# Patient Record
Sex: Female | Born: 1975 | Race: Black or African American | Hispanic: No | Smoking: Current every day smoker
Health system: Southern US, Community
[De-identification: ages and names within clinical notes are randomized; demographics above are authoritative.]

## PROBLEM LIST (undated history)

## (undated) DIAGNOSIS — I10 Essential (primary) hypertension: Secondary | ICD-10-CM

## (undated) DIAGNOSIS — F419 Anxiety disorder, unspecified: Secondary | ICD-10-CM

## (undated) DIAGNOSIS — E669 Obesity, unspecified: Secondary | ICD-10-CM

## (undated) HISTORY — PX: TUBAL LIGATION: SHX77

## (undated) HISTORY — DX: Obesity, unspecified: E66.9

---

## 2007-01-09 ENCOUNTER — Ambulatory Visit: Payer: Self-pay | Admitting: Physician Assistant

## 2007-03-15 ENCOUNTER — Ambulatory Visit: Payer: Self-pay | Admitting: Obstetrics & Gynecology

## 2007-04-05 ENCOUNTER — Ambulatory Visit: Payer: Self-pay | Admitting: Family Medicine

## 2007-04-16 ENCOUNTER — Encounter: Payer: Self-pay | Admitting: Family Medicine

## 2007-04-19 ENCOUNTER — Ambulatory Visit: Payer: Self-pay | Admitting: Family Medicine

## 2007-04-19 DIAGNOSIS — M545 Low back pain, unspecified: Secondary | ICD-10-CM | POA: Insufficient documentation

## 2007-04-23 ENCOUNTER — Encounter: Admission: RE | Admit: 2007-04-23 | Discharge: 2007-04-23 | Payer: Self-pay | Admitting: Family Medicine

## 2007-04-26 ENCOUNTER — Encounter: Admission: RE | Admit: 2007-04-26 | Discharge: 2007-05-09 | Payer: Self-pay | Admitting: Family Medicine

## 2007-08-21 ENCOUNTER — Ambulatory Visit: Payer: Self-pay | Admitting: Obstetrics & Gynecology

## 2007-08-28 ENCOUNTER — Ambulatory Visit: Payer: Self-pay | Admitting: Obstetrics & Gynecology

## 2007-10-15 ENCOUNTER — Telehealth: Payer: Self-pay | Admitting: Family Medicine

## 2007-11-05 ENCOUNTER — Ambulatory Visit: Payer: Self-pay | Admitting: Obstetrics and Gynecology

## 2008-01-10 ENCOUNTER — Ambulatory Visit: Payer: Self-pay | Admitting: Physician Assistant

## 2008-01-10 LAB — CONVERTED CEMR LAB: Chlamydia, DNA Probe: NEGATIVE

## 2008-02-04 ENCOUNTER — Ambulatory Visit: Payer: Self-pay | Admitting: Family

## 2008-02-05 ENCOUNTER — Encounter: Payer: Self-pay | Admitting: Family

## 2008-02-05 LAB — CONVERTED CEMR LAB: Trich, Wet Prep: NONE SEEN

## 2008-02-17 ENCOUNTER — Ambulatory Visit: Payer: Self-pay | Admitting: Family Medicine

## 2008-03-09 ENCOUNTER — Telehealth: Payer: Self-pay | Admitting: Family Medicine

## 2008-03-12 ENCOUNTER — Telehealth: Payer: Self-pay | Admitting: Family Medicine

## 2008-03-16 ENCOUNTER — Encounter: Admission: RE | Admit: 2008-03-16 | Discharge: 2008-03-17 | Payer: Self-pay | Admitting: Family Medicine

## 2008-03-20 ENCOUNTER — Encounter: Payer: Self-pay | Admitting: Family

## 2008-03-20 ENCOUNTER — Ambulatory Visit: Payer: Self-pay | Admitting: Family

## 2008-03-20 LAB — CONVERTED CEMR LAB: Trich, Wet Prep: NONE SEEN

## 2008-04-10 ENCOUNTER — Ambulatory Visit: Payer: Self-pay | Admitting: Physician Assistant

## 2008-04-14 ENCOUNTER — Ambulatory Visit: Payer: Self-pay | Admitting: Family Medicine

## 2008-04-16 ENCOUNTER — Encounter: Payer: Self-pay | Admitting: Family Medicine

## 2008-04-20 ENCOUNTER — Encounter: Payer: Self-pay | Admitting: Family Medicine

## 2008-05-05 ENCOUNTER — Ambulatory Visit: Payer: Self-pay | Admitting: Family Medicine

## 2008-05-06 ENCOUNTER — Encounter: Payer: Self-pay | Admitting: Family Medicine

## 2008-05-06 ENCOUNTER — Encounter: Admission: RE | Admit: 2008-05-06 | Discharge: 2008-05-06 | Payer: Self-pay | Admitting: Family Medicine

## 2008-05-06 ENCOUNTER — Telehealth (INDEPENDENT_AMBULATORY_CARE_PROVIDER_SITE_OTHER): Payer: Self-pay

## 2008-05-06 ENCOUNTER — Ambulatory Visit: Payer: Self-pay | Admitting: Family Medicine

## 2008-05-06 DIAGNOSIS — R1031 Right lower quadrant pain: Secondary | ICD-10-CM | POA: Insufficient documentation

## 2008-05-06 LAB — CONVERTED CEMR LAB
Glucose, Urine, Semiquant: NEGATIVE
Specific Gravity, Urine: 1.015
pH: 7

## 2008-05-08 ENCOUNTER — Encounter: Payer: Self-pay | Admitting: Family Medicine

## 2008-05-08 ENCOUNTER — Encounter: Admission: RE | Admit: 2008-05-08 | Discharge: 2008-05-08 | Payer: Self-pay | Admitting: Obstetrics & Gynecology

## 2008-05-08 ENCOUNTER — Ambulatory Visit: Payer: Self-pay | Admitting: Obstetrics and Gynecology

## 2008-05-08 LAB — CONVERTED CEMR LAB
Basophils Absolute: 0 10*3/uL (ref 0.0–0.1)
Basophils Relative: 0 % (ref 0–1)
Candida species: NEGATIVE
Eosinophils Relative: 1 % (ref 0–5)
HCT: 37.6 % (ref 36.0–46.0)
Hemoglobin: 12.2 g/dL (ref 12.0–15.0)
Lymphocytes Relative: 12 % (ref 12–46)
MCHC: 32.4 g/dL (ref 30.0–36.0)
Monocytes Absolute: 0.9 10*3/uL (ref 0.1–1.0)
Monocytes Relative: 6 % (ref 3–12)
RDW: 12.9 % (ref 11.5–15.5)

## 2008-05-11 ENCOUNTER — Telehealth: Payer: Self-pay | Admitting: Family Medicine

## 2008-05-29 ENCOUNTER — Ambulatory Visit: Payer: Self-pay | Admitting: Family Medicine

## 2008-05-29 DIAGNOSIS — K5289 Other specified noninfective gastroenteritis and colitis: Secondary | ICD-10-CM | POA: Insufficient documentation

## 2008-06-17 ENCOUNTER — Encounter: Admission: RE | Admit: 2008-06-17 | Discharge: 2008-06-17 | Payer: Self-pay | Admitting: Obstetrics & Gynecology

## 2008-07-31 ENCOUNTER — Ambulatory Visit: Payer: Self-pay | Admitting: Family

## 2008-07-31 LAB — CONVERTED CEMR LAB: Trich, Wet Prep: NONE SEEN

## 2008-08-05 ENCOUNTER — Telehealth: Payer: Self-pay | Admitting: Family Medicine

## 2008-08-21 ENCOUNTER — Ambulatory Visit: Payer: Self-pay | Admitting: Physician Assistant

## 2008-08-22 ENCOUNTER — Encounter: Payer: Self-pay | Admitting: Physician Assistant

## 2008-08-22 LAB — CONVERTED CEMR LAB
Trich, Wet Prep: NONE SEEN
Yeast Wet Prep HPF POC: NONE SEEN

## 2008-09-25 ENCOUNTER — Ambulatory Visit: Payer: Self-pay | Admitting: Obstetrics and Gynecology

## 2009-01-11 ENCOUNTER — Ambulatory Visit: Payer: Self-pay | Admitting: Obstetrics and Gynecology

## 2009-01-12 ENCOUNTER — Encounter: Payer: Self-pay | Admitting: Obstetrics and Gynecology

## 2009-01-12 LAB — CONVERTED CEMR LAB

## 2009-01-20 ENCOUNTER — Ambulatory Visit: Payer: Self-pay | Admitting: Obstetrics & Gynecology

## 2009-01-20 LAB — CONVERTED CEMR LAB
Chlamydia, DNA Probe: NEGATIVE
Clue Cells Wet Prep HPF POC: NONE SEEN
Yeast Wet Prep HPF POC: NONE SEEN

## 2009-06-05 ENCOUNTER — Ambulatory Visit: Payer: Self-pay | Admitting: Family Medicine

## 2009-06-05 DIAGNOSIS — Z8742 Personal history of other diseases of the female genital tract: Secondary | ICD-10-CM | POA: Insufficient documentation

## 2009-06-05 LAB — CONVERTED CEMR LAB
Blood in Urine, dipstick: NEGATIVE
Specific Gravity, Urine: 1.02
Urobilinogen, UA: 4
pH: 7

## 2009-06-11 LAB — CONVERTED CEMR LAB
ALT: 11 units/L (ref 0–35)
AST: 14 units/L (ref 0–37)
BUN: 10 mg/dL (ref 6–23)
Basophils Absolute: 0 10*3/uL (ref 0.0–0.1)
Basophils Relative: 0 % (ref 0–1)
Calcium: 9.2 mg/dL (ref 8.4–10.5)
Chloride: 102 meq/L (ref 96–112)
Creatinine, Ser: 0.89 mg/dL (ref 0.40–1.20)
Eosinophils Absolute: 0.1 10*3/uL (ref 0.0–0.7)
Eosinophils Relative: 1 % (ref 0–5)
HCT: 42.3 % (ref 36.0–46.0)
Hgb A1c MFr Bld: 5.4 % (ref 4.6–6.1)
MCHC: 33.3 g/dL (ref 30.0–36.0)
MCV: 95.5 fL (ref 78.0–100.0)
Neutrophils Relative %: 66 % (ref 43–77)
Platelets: 196 10*3/uL (ref 150–400)
RDW: 12.9 % (ref 11.5–15.5)
Total Bilirubin: 0.5 mg/dL (ref 0.3–1.2)
WBC: 11.3 10*3/uL — ABNORMAL HIGH (ref 4.0–10.5)

## 2009-07-20 ENCOUNTER — Ambulatory Visit: Payer: Self-pay | Admitting: Obstetrics & Gynecology

## 2009-07-21 ENCOUNTER — Encounter: Payer: Self-pay | Admitting: Obstetrics & Gynecology

## 2009-07-21 LAB — CONVERTED CEMR LAB: GC Probe Amp, Genital: NEGATIVE

## 2009-07-30 ENCOUNTER — Ambulatory Visit: Payer: Self-pay | Admitting: Obstetrics & Gynecology

## 2009-07-30 LAB — CONVERTED CEMR LAB
Trich, Wet Prep: NONE SEEN
Yeast Wet Prep HPF POC: NONE SEEN

## 2010-01-26 ENCOUNTER — Emergency Department (HOSPITAL_BASED_OUTPATIENT_CLINIC_OR_DEPARTMENT_OTHER): Admission: EM | Admit: 2010-01-26 | Discharge: 2010-01-26 | Payer: Self-pay | Admitting: Emergency Medicine

## 2010-01-26 ENCOUNTER — Ambulatory Visit: Payer: Self-pay | Admitting: Radiology

## 2010-04-12 NOTE — Assessment & Plan Note (Signed)
Summary: LEFT KNEE SWELL PAIN/BACK STRAIN/VAG IRRITATION/TJ x 1wk rm 3   Vital Signs:  Patient Profile:   35 Years Old Female CC:      L Knee pain/LBP? Vaginal irritation  - x 1 wk Height:     62.5 inches Weight:      184 pounds O2 Sat:      100 % O2 treatment:    Room Air Temp:     97.2 degrees F oral Pulse rate:   93 / minute Pulse rhythm:   regular Resp:     16 per minute BP sitting:   134 / 93  (right arm) Cuff size:   regular  Vitals Entered By: Areta Haber, CMA                  Prior Medication List:  * TENS UNIT Condition: Recurrent chronic low back pain. Responded well to unit during her Physical therapy session.   Duration: Lifetime. PROMETHAZINE HCL 25 MG TABS (PROMETHAZINE HCL) one by mouth every 4-6 hours as needed nausea FLEXERIL 10 MG TABS (CYCLOBENZAPRINE HCL) Take 1 tablet by mouth once a day at bedtime   Current Allergies: No known allergies History of Present Illness Chief Complaint: L Knee pain/LBP? Vaginal irritation  - x 1 wk History of Present Illness: Patient has had L knee pain and it is swollen. She has beeen doing increase work w/ a client causing increase back pain and then noticied Tuesday L knee pain . Past HX of L knee pain w/ dislocation.  She nows has back and L knee pain.   She also has had recurrent yeast infection dicharge and was to be tested for diabetes.   Current Problems: DIABETES MELLITUS, GESTATIONAL, HX OF (ICD-V13.29) VAGINITIS (ICD-616.10) BACK PAIN (ICD-724.5) MUSCLE SPASM (ICD-728.85) GASTROENTERITIS (ICD-558.9) RLQ PAIN (ICD-789.03) LUMBAGO (ICD-724.2)   Current Meds * TENS UNIT Condition: Recurrent chronic low back pain. Responded well to unit during her Physical therapy session.   Duration: Lifetime. TERAZOL 3 80 MG SUPP (TERCONAZOLE) sig i per vagina at night x3 ORPHENADRINE CITRATE CR 100 MG XR12H-TAB (ORPHENADRINE CITRATE) sig 1 by mouth twice aday for back pain and muscle spasm MOBIC 7.5 MG TABS (MELOXICAM)  sig 1 by mouth q day HYDROCODONE-ACETAMINOPHEN 5-325 MG TABS (HYDROCODONE-ACETAMINOPHEN) sig 1 by mouth q 6-8hrs prn  REVIEW OF SYSTEMS Constitutional Symptoms      Denies fever, chills, night sweats, weight loss, weight gain, and fatigue.  Eyes       Denies change in vision, eye pain, eye discharge, glasses, contact lenses, and eye surgery. Ear/Nose/Throat/Mouth       Denies hearing loss/aids, change in hearing, ear pain, ear discharge, dizziness, frequent runny nose, frequent nose bleeds, sinus problems, sore throat, hoarseness, and tooth pain or bleeding.  Respiratory       Denies dry cough, productive cough, wheezing, shortness of breath, asthma, bronchitis, and emphysema/COPD.  Cardiovascular       Denies murmurs, chest pain, and tires easily with exhertion.    Gastrointestinal       Denies stomach pain, nausea/vomiting, diarrhea, constipation, blood in bowel movements, and indigestion. Genitourniary       Complains of blood or discharge from vagina.      Denies painful urination, kidney stones, and loss of urinary control.      Comments: Irritation- pool Neurological       Denies paralysis, seizures, and fainting/blackouts. Musculoskeletal       Complains of muscle pain, joint pain, decreased range of  motion, redness, and swelling.      Denies joint stiffness, muscle weakness, and gout.      Comments: L knee, LBP x 1 wk Skin       Denies bruising, unusual mles/lumps or sores, and hair/skin or nail changes.  Psych       Denies mood changes, temper/anger issues, anxiety/stress, speech problems, depression, and sleep problems. 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 Pt has not seen PCP for this.   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  Has 2 sons. Smokes 1/2 ppd x 13 yrs.-- quit 2009 No exercise. Fair diet.    Risk Factors: Caffeine Use: 2 (04/05/2007)  Risk Factors: Smoking Status: quit (04/14/2008) Packs/Day: 0.5 (04/05/2007)  Family History: Reviewed history from 04/05/2007 and no changes required. mother alive, breast CA at 55, DM, high chol, HTN father healthy no sibblings  Social History: Reviewed history from 04/14/2008 and no changes required. 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 Smokes 1/2 ppd x 13 yrs.-- quit 2009 No exercise. Fair diet.   Physical Exam General appearance: well developed, well nourished, no acute distress Head: normocephalic, atraumatic GU: external genitalia normal slight discharge present   bimanual and rectal exam negative Extremities: L knee some swelling and trnderness to palpation   Back: marked tenderness on both sides of back  Skin: no obvious rashes or lesions MSE: oriented to time, place, and person Assessment New Problems: DIABETES MELLITUS, GESTATIONAL, HX OF (ICD-V13.29) VAGINITIS (ICD-616.10) BACK PAIN (ICD-724.5) MUSCLE SPASM (ICD-728.85)  L knee pain  back pain HX of gestational diabetes  Patient Education: Patient and/or caregiver instructed in the following: rest fluids and Tylenol.  Plan New Medications/Changes: HYDROCODONE-ACETAMINOPHEN 5-325 MG TABS (HYDROCODONE-ACETAMINOPHEN) sig 1 by mouth q 6-8hrs prn  #20 x 0, 06/05/2009, Hassan Rowan MD MOBIC 7.5 MG TABS (MELOXICAM) sig 1 by mouth q day  #30 x 1, 06/05/2009, Hassan Rowan MD ORPHENADRINE CITRATE CR 100 MG XR12H-TAB (ORPHENADRINE CITRATE) sig 1 by mouth twice aday for back pain and muscle spasm  #30 x 1, 06/05/2009, Hassan Rowan MD TERAZOL 3 80 MG SUPP (TERCONAZOLE) sig i per vagina at night x3  #1 x  1, 06/05/2009, Hassan Rowan MD  New Orders: Est. Patient Level IV [16109] T-DG Knee 2 Views*L* [73560] Wet Prep [87210QW] KOH/ WET [place] [87210] T- GC Chlamydia [60454] T-CBC w/Diff [09811-91478] T-Comprehensive Metabolic Panel [80053-22900] T- Hemoglobin A1C [83036-23375] Est. Patient Level IV [99214] Wet Prep [87210QW] T- GC Chlamydia [86317] T-CBC w/Diff [29562-13086] T- Hemoglobin A1C [83036-23375] T-Comprehensive Metabolic Panel [80053-22900] Planning Comments:   as below  Follow Up: Follow up in 2-3 days if no improvement, Follow up on an as needed basis, Follow up with Primary Physician  The patient and/or caregiver has been counseled thoroughly with regard to medications prescribed including dosage, schedule, interactions, rationale for use, and possible side effects and they verbalize understanding.  Diagnoses and expected course of recovery discussed and will return if not improved as expected or if the condition worsens. Patient and/or caregiver verbalized understanding.  Prescriptions: HYDROCODONE-ACETAMINOPHEN 5-325 MG TABS (HYDROCODONE-ACETAMINOPHEN) sig 1 by mouth q 6-8hrs prn  #  20 x 0   Entered and Authorized by:   Hassan Rowan MD   Signed by:   Hassan Rowan MD on 06/05/2009   Method used:   Printed then faxed to ...       [facility] [address] (retail)       [address]       [place], [place]  [address]       Ph: [phone]       Fax: [phone]   RxID:   [id number] MOBIC 7.5 MG TABS (MELOXICAM) sig 1 by mouth q day  #30 x 1   Entered and Authorized by:   Hassan Rowan MD   Signed by:   Hassan Rowan MD on 06/05/2009   Method used:   Printed then faxed to ...       [facility] [address] (retail)       [address]       [place], [place]  [address]       Ph: [phone]       Fax: [phone]   RxID:   [id number] ORPHENADRINE CITRATE CR 100 MG XR12H-TAB (ORPHENADRINE CITRATE) sig 1 by mouth twice aday for back pain and muscle spasm  #30 x 1    Entered and Authorized by:   Hassan Rowan MD   Signed by:   Hassan Rowan MD on 06/05/2009   Method used:   Printed then faxed to ...       [facility] [address] (retail)       [address]       [place], [place]  [address]       Ph: [phone]       Fax: [phone]   RxID:   [id number] TERAZOL 3 80 MG SUPP (TERCONAZOLE) sig i per vagina at night x3  #1 x 1   Entered and Authorized by:   Hassan Rowan MD   Signed by:   Hassan Rowan MD on 06/05/2009   Method used:   Printed then faxed [facility] ...       [facility] [address]       [address]       [place], [place]  [address]       Ph: [phone]       Fax: [phone]   RxID:   [id number]   Patient Instructions: 1)  Please schedule a follow-up appointment as needed. 2)  Please schedule an appointment with your primary doctor in :3-14 days 3)  Most patients (90%) with low back pain will improve with time (2-6 weeks). Keep active but avoid activities that are painful. Apply moist heat and/or ice to lower back several times a day.  Laboratory Results   Urine Tests  Date/Time Received: June 05, 2009 5:35 PM  Date/Time Reported: June 05, 2009 5:35 PM   Routine Urinalysis   Color: yellow Appearance: Cloudy Glucose: negative   (Normal Range: Negative) Bilirubin: negative   (Normal Range: Negative) Ketone: small (15)   (Normal Range: Negative) Spec. Gravity: 1.020   (Normal Range: 1.003-1.035) Blood: negative   (Normal Range: Negative) pH: 7.0   (Normal Range: 5.0-8.0) Protein: trace   (Normal Range: Negative) Urobilinogen: 4.0   (Normal Range: 0-1)

## 2010-05-24 LAB — DIFFERENTIAL
Basophils Absolute: 0 10*3/uL (ref 0.0–0.1)
Basophils Relative: 1 % (ref 0–1)
Eosinophils Relative: 1 % (ref 0–5)
Monocytes Relative: 4 % (ref 3–12)
Neutro Abs: 7.3 10*3/uL (ref 1.7–7.7)

## 2010-05-24 LAB — URINALYSIS, ROUTINE W REFLEX MICROSCOPIC
Bilirubin Urine: NEGATIVE
Hgb urine dipstick: NEGATIVE
Ketones, ur: 15 mg/dL — AB
Specific Gravity, Urine: 1.019 (ref 1.005–1.030)
pH: 6 (ref 5.0–8.0)

## 2010-05-24 LAB — GC/CHLAMYDIA PROBE AMP, GENITAL
Chlamydia, DNA Probe: NEGATIVE
GC Probe Amp, Genital: NEGATIVE

## 2010-05-24 LAB — PREGNANCY, URINE: Preg Test, Ur: NEGATIVE

## 2010-05-24 LAB — BASIC METABOLIC PANEL
BUN: 10 mg/dL (ref 6–23)
CO2: 25 mEq/L (ref 19–32)
Calcium: 9.3 mg/dL (ref 8.4–10.5)
Creatinine, Ser: 0.9 mg/dL (ref 0.4–1.2)
GFR calc non Af Amer: >60 mL/min (ref >60)
Glucose, Bld: 74 mg/dL (ref 70–99)

## 2010-05-24 LAB — CBC
MCH: 32.3 pg (ref 26.0–34.0)
MCHC: 35.3 g/dL (ref 30.0–36.0)
Platelets: 174 10*3/uL (ref 150–400)
RDW: 11.9 % (ref 11.5–15.5)

## 2010-05-24 LAB — WET PREP, GENITAL
Clue Cells Wet Prep HPF POC: NONE SEEN
Trich, Wet Prep: NONE SEEN
Yeast Wet Prep HPF POC: NONE SEEN

## 2010-05-24 LAB — URINE MICROSCOPIC-ADD ON

## 2010-07-26 NOTE — Assessment & Plan Note (Signed)
NAMEDERRICA, Alexandra [name]             ACCOUNT NO.:  [id number]   MEDICAL RECORD NO.:  [id number]          PATIENT TYPE:  POB   LOCATION:  [facility]         FACILITY:  [facility]   PHYSICIAN:  Maylon Cos, CNM    DATE OF BIRTH:  1975/04/01   DATE OF SERVICE:                                  CLINIC NOTE   The patient presents today to the [facility] in  [place] for evaluation of right breast pain and also some vaginal  discomfort.  Alexandra [name] is a 35 year old African-American female who  has history over the last 6-8 months of having pain in her right breast  that she describes as sharp and shooting that radiates from the lateral  aspect of her right breast into her nipple area.  The pain is not  relieved by anything.  It is described as intermittent.  It is not  aggravated by anything either.  She has not noticed any skin changes.  However, she does state that she recently within the last 6 months has  noticed a small mass in her right breast that is only palpable when she  is lying on her left side.  Patient has been followed by mammography for  the last several years secondary to a family history of her mother being  diagnosed with advanced stages of breast cancer at the age of 60 and had  a double mastectomy.  Her last mammogram was approximately 3 years ago  and was noted to have fibrocystic changes that were being followed every  6 months and were stable.  Her second complaint today is some vaginal  discomfort that she has had for about a week that she believes to be  related to a change in some soap recently.  She is not complaining of  any discharge or any odor, just occasional vaginal discomfort.  She has  not taken any over-the-counter medications such as Monistat for this.  The first day of her last menstrual period was December 29, 2006, and  this was a normal period for her.  Her current contraceptive choice is  tubal ligation that was  performed several years ago after the birth of  her last child.  Her last Pap smear was done in 2008 by [facility] in [place], and a release of information has been sent for  those records as well as her last mammogram who was done in [facility].   PHYSICAL EXAMINATION:  VITAL SIGNS:  She is currently 61 inches tall and  has a weight of 191.  Her blood pressure today was 126/84.  Pulse was  83.  GENERAL:  On exam today, Alexandra [name] is a very pleasant, well-dressed, African-  American female who appears to be her stated age in no apparent  distress.  BREASTS:  The exam of her right breast reveals a small, linear  consolidation in the left upper quadrant of her right breast that is  mobile and nontender.  She is also noted to have a large consolidation  of tissue posterior to her nipple on that same breast within the areola  that is also mobile and nontender.  The area  noted in the left upper  quadrant is approximately 1 cm in size, and the area noted to be felt  within the margins of the posterior areola is approximately 2 cm x 3 cm  in size which she states has been there for quite some time and was felt  to be fibrocystic change on previous exam and also noted on previous  mammography.  Her left breast is completely normal, no irregular areas,  no masses, nontender.  Her nipple is erect without discharge.  However,  there is a small area in her axilla, of which I am unable to determine  if this is fatty tissue or some type of fibrocystic change.  PELVIC:  Her vaginal exam today revealed external genitalia without  lesion.  Mucous membranes are pink.  She does have a small amount of a  white mucusy discharge that appears to be watery in nature.  However, it  has no odor.  Her cervix is smooth, pink and nonfriable.  A specimen of  this was taken, and wet prep was performed.  Wet prep revealed hyphae as  well as buds, had a negative whiff test and also no clue cells.    PROBLEM LIST:  1. Right breast pain felt to be likely secondary to fibrocystic      changes and an increase in caffeine intake.  Patient was instructed      to decrease her caffeine intake to see if this makes a difference      in her pain.  She was also referred to [facility] for a      diagnostic mammography with request for ultrasound if felt to be      necessary of both breasts.  2. Vaginitis, probable Candida in nature.  Patient was given a      prescription for Diflucan 150 mg orally x1 with two refills and was      also instructed to use baking soda soaks as needed for comfort.      Patient was instructed to follow up in 4-6 weeks to evaluate this      right breast pain with decrease in caffeine intake and to review      her mammography results as needed.           ______________________________  Maylon Cos, CNM     SS/MEDQ  D:  01/09/2007  T:  01/09/2007  Job:  [id number]

## 2010-07-26 NOTE — Assessment & Plan Note (Signed)
[name], [name]             ACCOUNT NO.:  [id number]   MEDICAL RECORD NO.:  [id number]          PATIENT TYPE:  POB   LOCATION:  [facility] [place]         FACILITY:  [facility]   PHYSICIAN:  Caren Griffins, CNM       DATE OF BIRTH:  09-15-1975   DATE OF SERVICE:  11/05/2007                                  CLINIC NOTE   REASON FOR VISIT:  I think she has an yeast infection.   HISTORY:  This is a 35 year old P2, post tubal ligation, who has been  seen here previously for presumptive yeast infections and has been  treated with Diflucan in the past.  She also states that she has gotten  bacterial infections in her vagina, uncertain what type, but has been  treated with Flagyl at [facility] in the past.  She has also been seen  here for a pilonidal cyst, but states she does not frequently get boils.  Her risk factor does include obesity and history of insulin-requiring  gestational diabetes 5 years ago.  She states that for 5 days she has  had vaginal itching and also around the fourchette and labia.  She has  noticed an increase in discharge that is white.  The itching has been  present for about 5 days and for the past 2 days she has put local  cortisone cream externally due to intractable itching.  She also had  changed soap a few days ago, but changed back to her usual soap.  She  did not get the Lactobacillus as advised by Dr. Marice Potter back in June, when  she was treated for presumptive yeast.  She denies any isolated area of  itching, burning, or any known herpes lesion.   PHYSICAL EXAMINATION:  BP 112/80 and weight 197.  Pelvic exam, external  genitalia with no excoriations or lesions, minimal if any erythema.  No  edema.  BUS negative.  Vaginal vault is significant for large amount of  thin white-gray discharge.  Wet prep done is positive for many  trichomonads and WBCs.  The discharge is swabbed from the vagina, and  bimanual exam was deferred at this time.   ASSESSMENT:   Trichomonas vaginalis.   PLAN:  She is given a prescription for metronidazole 2 g stat, and she  is to return if her symptoms are not relieved by this.  She is advised  to not use cortisone topical and also not to douche.  CBC today is 128,  and we did discuss her risk for overt diabetes in the future  particularly if she is not able to lose weight.  She has been successful  in stopping smoking and again encouraged her on weight loss regimen .           ______________________________  Caren Griffins, CNM     DP/MEDQ  D:  11/05/2007  T:  11/06/2007  Job:  [id number]

## 2010-07-26 NOTE — Assessment & Plan Note (Signed)
NAMEJADORE, Alexandra [name]             ACCOUNT NO.:  [id number]   MEDICAL RECORD NO.:  [id number]          PATIENT TYPE:  POB   LOCATION:  [facility] [place]         FACILITY:  [facility]   PHYSICIAN:  Maylon Cos, CNM    DATE OF BIRTH:  [birth year]   DATE OF SERVICE:  08/21/2008                                  CLINIC NOTE   The patient presents with vaginal discharge and slight itching.  The  patient is well known to the [facility] and has been seen  multiple times for vaginal discharge and odor, had been treated multiple  times for bacterial vaginosis.  She was last seen approximately 3 weeks  prior to this visit on Jul 31, 2008, by Sid Falcon, certified  nurse midwife, and diagnosed bacterial vaginosis.  She was given  clindamycin suppositories with instructions to use 1 nightly times for 3  days.  The patient states that she did complete her clindamycin regimen,  however, now has continued complaints of discharge with slight itching.  States that the discharge started approximately 3 days ago.  She denies  odor on exam today.   PHYSICAL EXAMINATION:  GENERAL:  Alexandra [name] is a pleasant African American  female who appears to be her stated age of 45.  VITAL SIGNS:  Stable.  Her pulse is 88.  Her blood pressure is 127/82.  Her weight today is 182.  Her height is 61.5 inches.  GU:  She is a Tanner V.  There is a tiny healing ulcerated lesion noted  at the introitus that has slight discomfort with palpation.  Otherwise,  mucous membranes are pink and external genitalia are intact without  lesions.  There is a scant amount of white creamy discharge without odor  that is noted in the vault.  Cervix is nonfriable.  It is smooth without  lesion.  There is no cervical motion tenderness on exam.  Uterus has no  tenderness to palpation and adnexa are nonenlarged and nontender.  A wet  prep along with vaginal culture were sent to differentiate yeast.   ASSESSMENT:  Leukorrhea consistent  with normal vaginal discharge.   PLAN:  Anticipatory guidance, comfort measures, recommend the use of  baking soda soaks, one-half cup baking soda in warm bath water  soak  p.r.n. comfort.  The patient was also encouraged to use Replens over-the-  counter gels to maintain normal vaginal pH and increase her intake of  normal cultures such as found in yogurt and acidophilus.  RN staff will  call the patient if need be treated for cultures and HSV-2.  Antibody  was also sent to rule out healing HSV lesions.  The patient is up-to-  date on her annual Pap smear and exam and she should return at her next  annual exam or p.r.n. with problems.           ______________________________  Maylon Cos, CNM     SS/MEDQ  D:  08/31/2008  T:  09/01/2008  Job:  [id number]

## 2010-07-26 NOTE — Assessment & Plan Note (Signed)
NAMEDOMITILA, STETLER             ACCOUNT NO.:  [id number]   MEDICAL RECORD NO.:  [id number]          PATIENT TYPE:  POB   LOCATION:  [facility] [place]         FACILITY:  [facility]   PHYSICIAN:  Sid Falcon, CNM  DATE OF BIRTH:  [birth year]   DATE OF SERVICE:  03/20/2008                                  CLINIC NOTE   ADDENDUM   PELVIC:  The uterus was mobile and nontender.  No dominant masses and  adnexa was nontender.  Ovaries are normal size.      Sid Falcon, CNM     WM/MEDQ  D:  03/20/2008  T:  03/21/2008  Job:  [id number]

## 2010-07-26 NOTE — Assessment & Plan Note (Signed)
Alexandra [name], Alexandra [name]             ACCOUNT NO.:  [id number]   MEDICAL RECORD NO.:  [id number]          PATIENT TYPE:  POB   LOCATION:  [facility] [place]         FACILITY:  [facility]   PHYSICIAN:  Caren Griffins, CNM       DATE OF BIRTH:  [birth year]   DATE OF SERVICE:  05/08/2008                                  CLINIC NOTE   HISTORY:  This is a 35 year old G2, P2, status post tubal ligation  several years ago who had onset of severe right lower quadrant pelvic  pain 3 days ago.  At that time, she was seen at [facility]  and there was concern for appendicitis.  She did get a CT of the abdomen  showing no evidence of appendicitis and a CT of the pelvis showing  normal terminal ileum and low attenuation structures in both adnexa,  right greater than left, consistent with ovarian cysts.  Today, we sent  her for a transvaginal ultrasound which shows normal uterus, left adnexa  and hydrosalpinx versus early TOA on the right.  She was seen by Dr.  Linford Arnold 2 days ago and given a prescription for Cipro for a presumptive  UTI.  She has not begun taking that now and denies any urinary symptoms.  She was given oxycodone which she is taking regularly due to the pain  which is unabated.  She is now premenstrual.  Of note, she has one  partner and this was a new partner for about 3-4 months ago.  She states  that she felt hot, thinks that she had a fever after she was seen at  Urgent Care through yesterday.  She states that was taken at Dr.  Shelah Lewandowsky office and was low grade.  She denies chills, nausea,  vomiting or other systemic symptoms.  Bowel and bladder function are  normal.   PHYSICAL EXAMINATION:  GENERAL:  She looks uncomfortable, laying down.  VITAL SIGNS:  Pulse 89, temperature is pending, BP is 120/82.  ABDOMEN:  Obese and nondistended, soft.  She does have moderate to  severe right lower quadrant pain, also peri-epigastric discomfort to  palpation.  She has some guarding  and also mild rebound tenderness.  PELVIC:  She has physiologic looking discharge.  Her cervix is posterior  and there is not appreciable cervical motion tenderness.  She has  moderate diffuse tenderness to palpation of the uterus and moderate to  severe tenderness on palpation of right adnexa.  Left adnexa nontender  to palpation.  I cannot appreciate a mass or thickening due to body  habitus.   ASSESSMENT:  Early pelvic inflammatory disease.   PLAN:  Discussed with Dr. Marice Potter if her CBC is normal, which is still  pending, we will treat her for outpatient PID with Rocephin 125 mg IM  now and doxycycline 100 mg b.i.d. for 14 days.  She will return for a  follow-up ultrasound in 4-5 weeks followed by a visit care.  Of note, GC  and chlamydia were not repeated as they were done Tuesday at Urgent  Care, and results are not yet available.  We also had a negative GC and  chlamydia done here in November 2009.  She will continue on her  oxycodone for pain and is advised to call if her pain worsens of if she  has a temperature over 100.4.          ______________________________  Caren Griffins, CNM    DP/MEDQ  D:  05/08/2008  T:  05/08/2008  Job:  [id number]

## 2010-07-26 NOTE — Assessment & Plan Note (Signed)
NAMEJANSEN, Alexandra [name]             ACCOUNT NO.:  [id number]   MEDICAL RECORD NO.:  [id number]          PATIENT TYPE:  POB   LOCATION:  [facility]         FACILITY:  [facility]   PHYSICIAN:  Sid Falcon, CNM  DATE OF BIRTH:  07/13/1975   DATE OF SERVICE:  03/20/2008                                  CLINIC NOTE   CHIEF COMPLAINT:  The patient is here for annual well-woman exam.  She  is reporting a white discharge with some itching.  She is currently  using Diflucan for the itching.   MEDICAL HISTORY:  The patient reports no hospitalizations for a new  diagnosis in the past year.   MEDICATIONS:  None.   Last menstrual period was on March 09, 2008.   SOCIAL HISTORY:  The patient is with a new partner x2 months and has not  been screened for infection since.  No other complaints at this visit.   PHYSICAL EXAMINATION:  VITAL SIGNS:  Pulse 92, blood pressure 122/82,  weight 188 pounds, and height 61-1/2 inches.  GENERAL:  The patient is alert and oriented x3.  No signs of acute  distress.  NECK:  No thyromegaly.  CARDIOVASCULAR:  Regular rate and rhythm without murmurs, gallops, or  rubs.  LUNGS:  Clear to auscultation bilaterally.  BREASTS:  Soft and nontender.  No retractions.  No dimpling.  No nipple  discharge.  No masses palpated bilaterally and no lymphadenopathy.  ABDOMEN:  No hepatosplenomegaly.  Positive bowel sounds x4.  PELVIC:  Vagina, no abnormal discharge seen.  No odor.  No lesions and  no redness.  Cervix, no lesions.  Negative cervical motion tenderness.  No abnormal discharge.  Patent os.  EXTREMITIES:  Patellar DTRs 3/4 and no edema.   ASSESSMENT:  1. A well-woman exam.  2. Vaginal itching.   PLAN:  Since the patient had a bilateral tubal ligation, no birth  control method is needed.  Advised decreasing fat and carbohydrates and  increasing exercise.  Emphasized importance of self-breast examination  due to family history of breast cancer with  mother having double  mastectomy at the age of 58.   LABORATORY DATA:  Pap smear, Chlamydia, gonorrhea, and a wet prep sent  to Lab.  Patient advised on condom use for sexually transmitted  infection protection.  The patient is to follow up in 1 year for a well-  woman exam or sooner if needed.      Sid Falcon, CNM     WM/MEDQ  D:  03/20/2008  T:  03/20/2008  Job:  [id number]

## 2010-07-26 NOTE — Assessment & Plan Note (Signed)
NAMETAE, ROBAK             ACCOUNT NO.:  [id number]   MEDICAL RECORD NO.:  [id number]          PATIENT TYPE:  POB   LOCATION:  [facility] [place]         FACILITY:  [facility]   PHYSICIAN:  Elsie Lincoln, MD      DATE OF BIRTH:  May 01, 1975   DATE OF SERVICE:                                  CLINIC NOTE   SUMMARY:  This is a dictation for Alexandra [name] seen on March 15, 2007, for a boil or skin lesion on the buttocks x2 days and also  questions about vaginal irritation with slight odor.  The patient  reported noticing a lesion on the buttocks 2 days ago.  Some pain and  tenderness and intact lesion.  No fever.  No history of sexually  transmitted infections.  No known drug allergies.  Her last menstrual  period was on February 20, 2007.  Her last normal Pap smear was April  2008.  Upon inspection of the lesion there was a 2 x 2 cm lesion at the  sacral area.  It was warm to touch and no abnormal discharge seen.  It  was also red and isolated to this one area.  The assessment or diagnosis  for this lesion is epidermoid cyst.  The treatment plan is to use warm  compresses and to see if it resolves within 2 days.  However, if it does  not resolve or the pain and the lesion get increased in size she is to  return for followup for possible incision and drainage of the lesion.  The vaginal exam noted there was no abnormal discharge seen with  speculum exam.  The wet mount was negative for yeast, negative for clue,  negative for trich.  The plan for this, normal vaginal flora.  Informed  the patient that it is possible that the discharge is related to sex and  that the plan is just to observe for any worsening symptoms.  However,  at this time it is normal vaginal discharge.      Eino Farber Jerolyn Center, CNM    ______________________________  Elsie Lincoln, MD    WM/MEDQ  D:  03/15/2007  T:  03/15/2007  Job:  [id number]

## 2010-07-26 NOTE — Assessment & Plan Note (Signed)
NAME:  Alexandra [name], Alexandra [name]               ACCOUNT NO.:  [id number]   MEDICAL RECORD NO.:  [id number]          PATIENT TYPE:  POB   LOCATION:  [facility] at [place]         FACILITY:  [facility]   PHYSICIAN:  Elsie Lincoln, MD      DATE OF BIRTH:  1975-06-28   DATE OF SERVICE:  07/20/2009                                  CLINIC NOTE   HISTORY OF PRESENT ILLNESS:  The patient is a 35 year old female who  presents for vaginal discharge with an odor.  The patient had been  treated with MetroGel and Diflucan.  The discharge had gotten better on  the Diflucan, but then got worse after MetroGel.  She does use Vagisil  feminine wash, I told her to stay away from it because it can be very  irritative.  She is very sensitive to Monistat cream, so I do not think  she is to ever use MetroGel again.  She is to only use Flagyl.  She  should stay away from intravaginal use of any medication.  She should  use only Dove unscented soap.  She should use unscented detergent on her  underwear.  She should wash clean with clear water and blow dry her mons  pubis and the inguinal area with blow dryer to help decrease moisture in  the area.   PHYSICAL EXAMINATION:  VITAL SIGNS:  Pulse 100, blood pressure 136/98,  weight 182, height 61.5 inches.  GENERAL:  Well-nourished, well-developed, in no apparent stress.  HEENT:  Normocephalic and atraumatic.  ABDOMEN:  Soft and nontender.  GENITALIA:  Tanner V.  Vagina pink.  There is discharge coming from the  os.  Cervix closed, nontender.   ASSESSMENT AND PLAN:  A 35 year old female with bacterial vaginosis and  yeast.  1. Flagyl 500 mg p.o. b.i.d. for 7 days.  2. Diflucan 150 mg 1 tablet p.o. q.3 days x2.  3. Vulvar hygiene reviewed.  4. Return to clinic p.r.n.           ______________________________  Elsie Lincoln, MD     KL/MEDQ  D:  07/20/2009  T:  07/21/2009  Job:  [id number]

## 2010-07-26 NOTE — Assessment & Plan Note (Signed)
Alexandra [name], Alexandra [name]             ACCOUNT NO.:  [id number]   MEDICAL RECORD NO.:  [id number]          PATIENT TYPE:  POB   LOCATION:  [facility] [place]         FACILITY:  [facility]   PHYSICIAN:  Elsie Lincoln, MD      DATE OF BIRTH:  Nov 03, 1975   DATE OF SERVICE:  01/20/2009                                  CLINIC NOTE   The patient is a 35 year old female who is complaining of recurrent  yeast.  The patient was diagnosed with yeast infection and treated with  Diflucan on January 11, 2009.  She is still having symptoms.  She does  not use any over-the-counter products.  She does not participate in oral  sex.  She does not douche.  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  She has been diagnosed with BV in the past.  Her last wet prep did not  show BV, and she was GC and Chlamydia negative in May.  She has been  eating yogurt, but she has not started the probiotic that we suggested,  she is going to start that today.  Interestingly, she was gestational  diabetic.  I think we should test her with a 2-hour 75 g GTT just to  make sure she is not diabetic.  I am going to complete a yeast culture  today and another wet prep.  GC and Chlamydia and ordered 2-hour GTT.  I  explained to her the use of boric acid tablets twice a week to help from  the yeast.  She will make these herself and place them in her vagina.  We will see her back in 3 weeks to see how she is doing.  These cultures  take some time up to 2 weeks, and see if she is growing something other  than Candida albicans.           ______________________________  Elsie Lincoln, MD     KL/MEDQ  D:  01/20/2009  T:  01/21/2009  Job:  [id number]

## 2010-07-26 NOTE — Assessment & Plan Note (Signed)
Alexandra [name], Alexandra [name]             ACCOUNT NO.:  [id number]   MEDICAL RECORD NO.:  [id number]          PATIENT TYPE:  POB   LOCATION:  [facility] [place]         FACILITY:  [facility]   PHYSICIAN:  Maylon Cos, CNM    DATE OF BIRTH:  01-Nov-1975   DATE OF SERVICE:  04/10/2008                                  CLINIC NOTE   The patient presents with complaint of white vaginal discharge and mild  vaginal itching times 3-7 days.  The patient has a longstanding history  of multiple vaginal infections treated in our office over the course of  the last year.  She presents today with similar symptoms that she has  had multiple times.   On examination today, the patient's vital signs were abnormal.  The  patient had a blood pressure that was found to be elevated at 145/101.  Recheck today was again found to be abnormal with a diastolic of 90,  981/19.  The patient works at her [employer]  with Nani Gasser, MD and was returning to work after her visit  today at which time the patient was instructed to have it rechecked in  her office today and have Nani Gasser, MD be made aware of blood  pressure.  We will not start meds and let that be managed by her primary  care Vincent Streater at [facility].  On examination today, the patient there is a moderate amount of thin  white to gray discharge noted in the vaginal vault.  A specimen was  obtained for wet prep which was positive for whiff, positive for clue,  positive for hyphae and few WBCs.  The patient's last gonorrhea and  Chlamydia cultures were performed were negative and the patient declined  repeat of these examinations today.  On bimanual exam uterus is not  enlarged and nontender.  No cervical motion tenderness.  Adnexa are not  enlarged and nontender.   IMPRESSION:  1. Chronic bacterial vaginosis.  2. Vaginal candidal infection.   PLAN:  1. MetroGel with instructions to use one  applicator full at bedtime      times seven nights and then repeat times seven nights monthly      after.  Times three months.  2. Diflucan 50 mg times one.  The patient was given refills on both of      these prescriptions.  3. The patient is instructed to follow up p.r.n. as needed and her      next annual exam is due in January, 2011.           ______________________________  Maylon Cos, CNM     SS/MEDQ  D:  04/24/2008  T:  04/24/2008  Job:  [id number]

## 2010-07-26 NOTE — Assessment & Plan Note (Signed)
NAME:  Alexandra [name], Alexandra [name]               ACCOUNT NO.:  [id number]   MEDICAL RECORD NO.:  [id number]          PATIENT TYPE:  POB   LOCATION:  [facility] [place]         FACILITY:  [facility]   PHYSICIAN:  Allie Bossier, MD        DATE OF BIRTH:  08-10-1975   DATE OF SERVICE:  07/30/2009                                  CLINIC NOTE   [name] is a 35 year old lady who comes back 9 days after she saw  Dr. Penne Lash.  She is still complains of vaginal discharge.  She says  there is no odor and it is milky.  From her last visit, her cervical  cultures are negative.  Again for vaginal culture shows rare yeast.  Previous vaginal cultures showed normal vaginal flora.  On exam, she has  completely normal discharge.  There is no odor.  I have discussed with  [name], I feel that in light of repeatedly normal cervical  cultures, repeatedly normal vaginal cultures, and wet prep that this  discharge is her normal state.  I have cautioned her that should she see  a green discharge or have nasty odor or an itch that could be a sign of  an infection, she should come back for that.  I think she does feel  reassured that this is normal and she will follow up for her annual  exam.      Allie Bossier, MD     MCD/MEDQ  D:  07/30/2009  T:  07/31/2009  Job:  [id number]

## 2010-07-26 NOTE — Assessment & Plan Note (Signed)
NAMEJOHANNAH, Alexandra [name]             ACCOUNT NO.:  [id number]   MEDICAL RECORD NO.:  [id number]          PATIENT TYPE:  POB   LOCATION:  [facility] [place]         FACILITY:  [facility]   PHYSICIAN:  Caren Griffins, CNM       DATE OF BIRTH:  1975/03/23   DATE OF SERVICE:  09/25/2008                                  CLINIC NOTE   REASON FOR VISIT:  Vaginal discharge.   HISTORY:  Christel is concerned that she may have another vaginal infection.  She has had an increase in the amount of thin white milky discharge and  irritation and malodor associated.  Occasionally, she has had some  itching.  The abnormal discharge has been present for a week.  She is  also concerned that she is going to be starting antibiotic course next  week due to having wisdom teeth extracted and typically gets yeast  infection when she takes antibiotics orally.  Of note, she has not tried  the Replens or baking soda sitz as advised last visit.  She does state  that she has some Cleocin vaginal cream which she has used in the past  for bacterial infection.   PHYSICAL EXAMINATION:  GENERAL:  NAD.  VITAL SIGNS:  Weight 182, BP 124/78.  GU:  NEFG.  No lesions.  BUS negative.  Vagina rugated, pink, large  amount of creamy white to gray discharge.  Cervix is swabs clean.  No  lesions.  No CMT.  Uterus NSSP, nontender.  No adnexal tenderness or  masses.  Wet prep significant for many WBCs, clue cells are present, but  few, however, there is a fishy odor.   ASSESSMENT:  Possible bacterial vaginitis.   PLAN:  Again stressed using Replens or other vaginal lubricant to  normalize her pH, may also be interested in probiotics.  We will discuss  this with the vendors at the health food stores locally.  She is advised  not to use Cleocin as this is destructive to lactobacillus.  I went  ahead and gave her a prescription for MetroGel to use intravaginally 1  applicator at bedtime for 7 nights, and I also gave her Diflucan to use  as  directed previously if she gets a yeast type discharge and vaginal  itching during the time that she will be on her antibiotic course.           ______________________________  Caren Griffins, CNM     DP/MEDQ  D:  09/25/2008  T:  09/25/2008  Job:  [id number]

## 2010-07-26 NOTE — Assessment & Plan Note (Signed)
[name], [name]             ACCOUNT NO.:  [id number]   MEDICAL RECORD NO.:  [id number]          PATIENT TYPE:  POB   LOCATION:  [facility] [place]         FACILITY:  [facility]   PHYSICIAN:  Caren Griffins, CNM       DATE OF BIRTH:  [birth year]   DATE OF SERVICE:  01/11/2009                                  CLINIC NOTE   REASON FOR VISIT:  Vaginal discharge.   HISTORY:  Alexandra [name] is well known to our office and has been seen here  several times for vaginal discharge.  Of note, we had discussed at  length measures to normalize her vaginal pH and also the idea of trying  probiotics and she is intending to do both of these, but has not yet  done that.  She is describing now a little bit of vaginal itching and a  grayish discharge.  She has no dyspareunia.  She does say that she uses  excoriated soft soap instead of the regular kind of soft soap by mistake  1 or 2 times, otherwise no contact irritants that she knows of.   She does have a family history of hypertension, but herself has never  been diagnosed as hypertensive.  She does say that at her last primary  care visit with [facility], her blood pressure was also  elevated in about the range it is today.   Medications have included Xanax, which she is occasionally taking for  stress and of note, she did not try the Chantix for smoking cessation,  but plans to do so.   PHYSICAL EXAMINATION:  VITAL SIGNS:  BP 140/98.  GENERAL:  WN, WD, pleasant BF in NAD.  ABDOMEN:  Soft, flat, nontender.  PELVIC:  Normal external genitalia.  No lesions.  GENITOURINARY:  Vagina well rugated and pink.  No inflammation.  There  is a scant amount of white creamy discharge.  No malodor.  Cervix smooth  without lesions.  No CMT or cervical motion tenderness.  Uterus NSSP,  nontender.  No adnexal tenderness or masses.   ASSESSMENT:  Apparent physiologic discharge.   PLAN:  Wet prep is sent to.  Discuss release measures and prevention  measures again.  She is reassured and we will be contacting her with the  result of her wet prep.           ______________________________  Caren Griffins, CNM     DP/MEDQ  D:  01/11/2009  T:  01/12/2009  Job:  [id number]

## 2010-07-26 NOTE — Assessment & Plan Note (Signed)
Alexandra [name], Alexandra [name]             ACCOUNT NO.:  [id number]   MEDICAL RECORD NO.:  [id number]          PATIENT TYPE:  POB   LOCATION:  [facility] [place]         FACILITY:  [facility]   PHYSICIAN:  Sid Falcon, CNM  DATE OF BIRTH:  [birth year]   DATE OF SERVICE:  07/31/2008                                  CLINIC NOTE   CHIEF COMPLAINT:  Vaginal discharge and odor x3 weeks.   Denies pelvic pain, fever, body ache, or chills.   EXAMINATION:  PELVIC:  Vagina, no abnormal discharge seen.  No abnormal  lesions.  No odor noted.  CERVIX:  No abnormal discharge.  No abnormal lesions.  Negative cervical  motion tenderness.  Wet prep, discharge collected for wet prep and  cervical cultures obtained for GC and Chlamydia testing.   ASSESSMENT:  On wet mount, positive clue cells, positive whiff test, and  negative Trichomonas.   ASSESSMENT:  Bacterial vaginosis.   PLAN:  Clindamycin suppositories 1 nightly x3 days.  Refills x1.  Follow  up as needed.      Sid Falcon, CNM     WM/MEDQ  D:  07/31/2008  T:  07/31/2008  Job:  [id number]

## 2010-10-17 ENCOUNTER — Encounter: Payer: Self-pay | Admitting: *Deleted

## 2010-10-17 ENCOUNTER — Emergency Department (HOSPITAL_BASED_OUTPATIENT_CLINIC_OR_DEPARTMENT_OTHER)
Admission: EM | Admit: 2010-10-17 | Discharge: 2010-10-17 | Disposition: A | Discharge status: Home / Self Care | Payer: Medicaid Other | Attending: Emergency Medicine | Admitting: Emergency Medicine

## 2010-10-17 DIAGNOSIS — B9689 Other specified bacterial agents as the cause of diseases classified elsewhere: Secondary | ICD-10-CM | POA: Insufficient documentation

## 2010-10-17 DIAGNOSIS — R109 Unspecified abdominal pain: Secondary | ICD-10-CM | POA: Insufficient documentation

## 2010-10-17 DIAGNOSIS — A499 Bacterial infection, unspecified: Secondary | ICD-10-CM | POA: Insufficient documentation

## 2010-10-17 DIAGNOSIS — N760 Acute vaginitis: Secondary | ICD-10-CM | POA: Insufficient documentation

## 2010-10-17 HISTORY — DX: Anxiety disorder, unspecified: F41.9

## 2010-10-17 LAB — URINALYSIS, ROUTINE W REFLEX MICROSCOPIC
Bilirubin Urine: NEGATIVE
Glucose, UA: NEGATIVE mg/dL
Hgb urine dipstick: NEGATIVE
Ketones, ur: 15 mg/dL — AB
Leukocytes, UA: NEGATIVE
Protein, ur: NEGATIVE mg/dL
pH: 6 (ref 5.0–8.0)

## 2010-10-17 LAB — WET PREP, GENITAL

## 2010-10-17 MED ORDER — KETOROLAC TROMETHAMINE 30 MG/ML IJ SOLN: 30.0000 mg | Freq: Once | INTRAMUSCULAR | Status: DC

## 2010-10-17 MED ORDER — METRONIDAZOLE 500 MG PO TABS: 500.0000 mg | ORAL_TABLET | Freq: Two times a day (BID) | ORAL | Status: AC

## 2010-10-17 MED ORDER — SODIUM CHLORIDE 0.9 % IV BOLUS (SEPSIS): 1000.0000 mL | Freq: Once | INTRAVENOUS | Status: DC

## 2010-10-17 MED ORDER — ONDANSETRON HCL 4 MG/2ML IJ SOLN: 4.0000 mg | Freq: Once | INTRAMUSCULAR | Status: DC

## 2010-10-17 MED ORDER — IBUPROFEN 600 MG PO TABS: 600.0000 mg | ORAL_TABLET | Freq: Four times a day (QID) | ORAL | Status: AC | PRN

## 2010-10-17 MED ORDER — OXYCODONE-ACETAMINOPHEN 5-325 MG PO TABS
1.0000 | ORAL_TABLET | Freq: Once | ORAL | Status: AC
  Administered 2010-10-17: 1 via ORAL
  Filled 2010-10-17: qty 1

## 2010-10-17 MED ORDER — FLUCONAZOLE 150 MG PO TABS: 150.0000 mg | ORAL_TABLET | Freq: Every day | ORAL | Status: AC

## 2010-10-17 NOTE — ED Notes (Signed)
Patient is resting comfortably. 

## 2010-10-17 NOTE — ED Notes (Signed)
Abd pressure. Vaginal discharge. Treated for yeast infection 3 weeks ago.

## 2010-10-17 NOTE — ED Provider Notes (Signed)
History    Scribed for Forbes Cellar, MD, the patient was seen in room MH07/MH07. This chart was scribed by Clarita Crane. This patient's care was started at 9:34PM.  CSN: [id number] Arrival date & time: 10/17/2010  8:17 PM  Chief Complaint  Patient presents with  . Abdominal Pain   HPI Patient is a 35 year old female c/o right lower quadrant abdominal pain with associated whitish-clear vaginal discharge onset 1 week ago and persistent since. Also notes she had been experiencing "pressure" with urination for the past 3-4 days but denies burning or significant pain. Denies nausea, vomiting, back pain, hematuria, fever, chills. Patient notes she was treated for a yeast infection 3 weeks ago. Patient is sexually active, notes use of condoms and denies h/o sexually transmitted diseases. Denies h/o abdominal surgery but reports surgical h/o tubal ligation. LMP- 3 weeks ago. Patient is a current everyday smoker, alcohol user and denies drug abuse.  Denies N/V/f/chills. No pain localizing to RLQ. No back pain. Denies constipation/diarrhea  PAST MEDICAL HISTORY:  Past Medical History  Diagnosis Date  . Anxiety     PAST SURGICAL HISTORY:  Past Surgical History  Procedure Date  . Tubal ligation     MEDICATIONS:  Previous Medications   ALPRAZOLAM (XANAX) 0.25 MG TABLET    Take 0.25 mg by mouth as needed. anxiety    BEE POLLEN 1000 MG TABS    Take 1 tablet by mouth daily.     OVER THE COUNTER MEDICATION    Take 1 tablet by mouth daily. Green Coffee Bean      ALLERGIES:  Allergies as of 10/17/2010  . (No Known Allergies)     FAMILY HISTORY:  No family history on file.   SOCIAL HISTORY: History   Social History  . Marital Status: Divorced    Spouse Name: N/A    Number of Children: N/A  . Years of Education: N/A   Social History Main Topics  . Smoking status: Current Everyday Smoker -- 1.0 packs/day  . Smokeless tobacco: None  . Alcohol Use: Yes  . Drug Use: No  . Sexually  Active:    Other Topics Concern  . None   Social History Narrative  . None     Review of Systems 10 Systems reviewed and are negative for acute change except as noted in the HPI.  Physical Exam  BP 130/87  Pulse 76  Resp 22  SpO2 100%  LMP 09/29/2010  Physical Exam  Nursing note and vitals reviewed. Constitutional: She is oriented to person, place, and time. She appears well-developed and well-nourished. No distress.  HENT:  Head: Normocephalic and atraumatic.  Eyes: Conjunctivae are normal. Pupils are equal, round, and reactive to light.  Neck: Neck supple.  Cardiovascular: Normal rate and regular rhythm.  Exam reveals no gallop and no friction rub.   No murmur heard. Pulmonary/Chest: Effort normal and breath sounds normal. She has no wheezes. She has no rales.  Abdominal: Soft. Bowel sounds are normal. She exhibits no distension and no mass. There is tenderness. There is no rebound and no guarding.       Mild diffuse lower abd ttp  Genitourinary: Vaginal discharge found.       Pelvic exam chaperoned. Vaginal discharge noted. Minimal left and right sided adnexal tenderness. No cervical motion tenderness. Normal external genitalia.   Musculoskeletal: Normal range of motion.  Neurological: She is alert and oriented to person, place, and time. No sensory deficit.  Skin: Skin is warm  and dry.  Psychiatric: She has a normal mood and affect. Her behavior is normal.   ED Course  Procedures  OTHER DATA REVIEWED: Nursing notes, vital signs, and past medical records reviewed.   DIAGNOSTIC STUDIES:   LABS / RADIOLOGY: Results for orders placed during the hospital encounter of 10/17/10  URINALYSIS, ROUTINE W REFLEX MICROSCOPIC      Component Value Range   Color, Urine YELLOW  YELLOW    Appearance CLOUDY (*) CLEAR    Specific Gravity, Urine 1.030  1.005 - 1.030    pH 6.0  5.0 - 8.0    Glucose, UA NEGATIVE  NEGATIVE (mg/dL)   Hgb urine dipstick NEGATIVE  NEGATIVE     Bilirubin Urine NEGATIVE  NEGATIVE    Ketones, ur 15 (*) NEGATIVE (mg/dL)   Protein, ur NEGATIVE  NEGATIVE (mg/dL)   Urobilinogen, UA 1.0  0.0 - 1.0 (mg/dL)   Nitrite NEGATIVE  NEGATIVE    Leukocytes, UA NEGATIVE  NEGATIVE   PREGNANCY, URINE      Component Value Range   Preg Test, Ur NEGATIVE    WET PREP, GENITAL      Component Value Range   Yeast, Wet Prep NONE SEEN  NONE SEEN    Trich, Wet Prep NONE SEEN  NONE SEEN    Clue Cells, Wet Prep MODERATE (*) NONE SEEN    WBC, Wet Prep HPF POC FEW (*) NONE SEEN    No results found.   PROCEDURES:  ED COURSE / COORDINATION OF CARE: 9:38PM- Pelvic exam performed with chaperone present.  10:51 PM  Denies abdominal pain at this time. +BV on wet prep. Will treat. Pt to f/u with gyn  MDM: Differential Diagnosis: cervicitis, vaginitis, less likely TOA, less likely appy or other intraabdominal infection,     PLAN: Discharge The patient is to return the emergency department if there is any worsening of symptoms. I have reviewed the discharge instructions with the patient/family   CONDITION ON DISCHARGE: Stable  MEDICATIONS GIVEN IN THE E.D.  Medications  ALPRAZolam (XANAX PO) (not administered)  ALPRAZolam (XANAX) 0.25 MG tablet (not administered)  Bee Pollen 1000 MG TABS (not administered)  OVER THE COUNTER MEDICATION (not administered)  oxyCODONE-acetaminophen (PERCOCET) 5-325 MG per tablet 1 tablet (1 tablet Oral Given 10/17/10 2159)     I personally performed the services described in this documentation, which was scribed in my presence. The recorded information has been reviewed and considered. Forbes Cellar, MD      Forbes Cellar, MD 10/17/10 [id number]

## 2010-10-17 NOTE — ED Notes (Signed)
Pt reports vaginal discharge x1 week. Alternates between a thick white versus clear discharge. Also reports suprapubic pain x3-4days. Described as cramping. States she has frequent yeast infections and bacterial infections "they cant ever really figure out what causes them. Its like if i have infection, then it causes another". Pt states she is in the process of finding a local GYN MD.

## 2010-10-17 NOTE — ED Notes (Signed)
Pt waiting for MD eval.

## 2010-10-19 LAB — GC/CHLAMYDIA PROBE AMP, GENITAL
Chlamydia, DNA Probe: NEGATIVE
GC Probe Amp, Genital: NEGATIVE

## 2011-06-04 ENCOUNTER — Emergency Department
Admission: EM | Admit: 2011-06-04 | Discharge: 2011-06-04 | Disposition: A | Discharge status: Home / Self Care | Payer: Medicaid Other | Source: Home / Self Care | Attending: Emergency Medicine | Admitting: Emergency Medicine

## 2011-06-04 DIAGNOSIS — N760 Acute vaginitis: Secondary | ICD-10-CM

## 2011-06-04 LAB — POCT URINALYSIS DIP (MANUAL ENTRY)
Blood, UA: NEGATIVE
Glucose, UA: NEGATIVE
Nitrite, UA: NEGATIVE
Spec Grav, UA: >=1.03 (ref 1.005–1.03)
Urobilinogen, UA: 0.2 (ref 0–1)
pH, UA: 5.5 (ref 5–8)

## 2011-06-04 MED ORDER — FLUCONAZOLE 150 MG PO TABS: 150.0000 mg | ORAL_TABLET | Freq: Once | ORAL | Status: AC

## 2011-06-04 MED ORDER — METRONIDAZOLE 500 MG PO TABS: 500.0000 mg | ORAL_TABLET | Freq: Two times a day (BID) | ORAL | Status: AC

## 2011-06-04 NOTE — ED Provider Notes (Signed)
History     CSN: [id number]  Arrival date & time 06/04/11  1204   First MD Initiated Contact with Patient 06/04/11 1205      No chief complaint on file.   (Consider location/radiation/quality/duration/timing/severity/associated sxs/prior treatment) HPI Alexandra [name] is a 36 y.o. female who presents today with UTI symptoms for a few days. Intermittent and mild in severity.  Not using any medications.  No recent antibiotics. No dysuria + frequency No urgency No hematuria + vaginal discharge (white, no odor).  She has a history of BV and yeast infections. No fever/chills + lower abdominal "discomfort" No back pain No fatigue    Past Medical History  Diagnosis Date  . Anxiety     Past Surgical History  Procedure Date  . Tubal ligation     No family history on file.  History  Substance Use Topics  . Smoking status: Current Everyday Smoker -- 1.0 packs/day  . Smokeless tobacco: Not on file  . Alcohol Use: Yes    OB History    Grav Para Term Preterm Abortions TAB SAB Ect Mult Living                  Review of Systems  All other systems reviewed and are negative.    Allergies  Review of patient's allergies indicates no known allergies.  Home Medications   Current Outpatient Rx  Name Route Sig Dispense Refill  . ALPRAZOLAM 0.25 MG PO TABS Oral Take 0.25 mg by mouth as needed. anxiety     . BEE POLLEN 1000 MG PO TABS Oral Take 1 tablet by mouth daily.      Marland Kitchen OVER THE COUNTER MEDICATION Oral Take 1 tablet by mouth daily. Green Coffee Bean       There were no vitals taken for this visit.  Physical Exam  Nursing note and vitals reviewed. Constitutional: She is oriented to person, place, and time. She appears well-developed and well-nourished.  HENT:  Head: Normocephalic and atraumatic.  Eyes: No scleral icterus.  Neck: Neck supple.  Cardiovascular: Regular rhythm and normal heart sounds.   Pulmonary/Chest: Effort normal and breath sounds normal. No respiratory  distress.  Abdominal: Soft. Normal appearance and bowel sounds are normal. She exhibits no mass. There is no rebound, no guarding and no CVA tenderness.  Neurological: She is alert and oriented to person, place, and time.  Skin: Skin is warm and dry.  Psychiatric: She has a normal mood and affect. Her speech is normal.    ED Course  Procedures (including critical care time)  Labs Reviewed - No data to display No results found.   1. Dysuria       MDM  1) Take the prescribed antibiotic as directed.  Likely BV but will also treat for yeast.  In addition, advised the patient she is to follow up with her gynecologist since she had an abnormal Pap smear and has not had a repeat done yet.  If still having symptoms at that time, she will likely need to be swabbed for further things such as gonorrhea and Chlamydia which we didn't do today in clinic. 2) A urinalysis was done in clinic.  A urine culture is pending.  If positive, will need to  3) Follow up with your PCP or urologist if not improving or if worsening symptoms.       Marlaine Hind, MD 06/04/11 1425

## 2011-06-04 NOTE — ED Notes (Signed)
States symptoms has been going on for a couple of weeks, states she has to schedule gyn appt d/t abnormal pap.  Denies pain with urination, has had white vaginal discharge. Hx. Of BV and yeast infections

## 2011-06-05 LAB — URINE CULTURE
Colony Count: NO GROWTH
Organism ID, Bacteria: NO GROWTH

## 2011-06-06 ENCOUNTER — Ambulatory Visit (INDEPENDENT_AMBULATORY_CARE_PROVIDER_SITE_OTHER): Payer: Medicaid Other | Admitting: Obstetrics & Gynecology

## 2011-06-06 ENCOUNTER — Encounter: Payer: Self-pay | Admitting: Obstetrics & Gynecology

## 2011-06-06 VITALS — BP 138/101 | HR 97 | Temp 97.0°F | Resp 17 | Ht 64.0 in | Wt 197.0 lb

## 2011-06-06 DIAGNOSIS — N898 Other specified noninflammatory disorders of vagina: Secondary | ICD-10-CM

## 2011-06-06 NOTE — Progress Notes (Signed)
  Subjective:    Patient ID: Alexandra [name], female    DOB: 1976/02/12, 36 y.o.   MRN: [id number]  HPI  Alexandra [name] comes in today with a complaint of a non-odorous vaginal discharge and perianal itching. This issue was very common over the past few years and the work up has been negative to date. She thinks that it has been better to date because she has been using probiotics Restora and Allign.  Review of Systems Her pap is due    Objective:   Physical Exam  Normal vagina and discharge and anus      Assessment & Plan:  Reassurance given. I have sent a wet prep. She will schedule an annual exam.

## 2011-06-07 LAB — WET PREP, GENITAL: Trich, Wet Prep: NONE SEEN

## 2011-06-17 ENCOUNTER — Emergency Department
Admission: EM | Admit: 2011-06-17 | Discharge: 2011-06-17 | Disposition: A | Discharge status: Home / Self Care | Payer: Medicaid Other | Source: Home / Self Care

## 2011-06-17 DIAGNOSIS — L293 Anogenital pruritus, unspecified: Secondary | ICD-10-CM

## 2011-06-17 DIAGNOSIS — N898 Other specified noninflammatory disorders of vagina: Secondary | ICD-10-CM

## 2011-06-17 DIAGNOSIS — N760 Acute vaginitis: Secondary | ICD-10-CM

## 2011-06-17 NOTE — ED Notes (Signed)
Seen here x 1 week ago for vaginal concerns, did not take prescribed meds went to GYN one week ago too. States she does not have discharge but is having itching and burning

## 2011-06-17 NOTE — Discharge Instructions (Signed)
Warm soaks, dry area well after bathing No trich, yeast or clue cells, await GC/CT culture. Condoms for STD prevention.  Vaginitis Vaginitis in a soreness, swelling and redness (inflammation) of the vagina and vulva. This is not a sexually transmitted infection.  CAUSES  Yeast vaginitis is caused by yeast (candida) that is normally found in your vagina. With a yeast infection, the candida has over grown in number to a point that upsets the chemical balance. SYMPTOMS   White thick vaginal discharge.   Swelling, itching, redness and irritation of the vagina and possibly the lips of the vagina (vulva).   Burning or painful urination.   Painful intercourse.  HOME CARE INSTRUCTIONS   Finish all medication as prescribed.   Do not have sex until treatment is completed or instructed by your healthcare giver.   Take warm sitz baths.   Do not douche.   Do not use tampons, especially scented ones.   Wear cotton underwear.   Avoid tight pants and panty hose.   Tell your sexual partner that you have a yeast infection. They should go to their caregiver if they have symptoms such as mild rash or itching.   Your sexual partner should be treated if your infection is difficult to eliminate.   Practice safer sex. Use condoms.   Some vaginal medications cause latex condoms to fail. Ask your caregiver this.  SEEK MEDICAL CARE IF:   You develop a fever.   The infection is getting worse after 2 days of treatment.   The infection is not getting better after 3 days of treatment.   You develop blisters in or around your vagina.   You develop vaginal bleeding, and it is not your menstrual period.   You have pain when you urinate.   You develop intestinal problems.   You have pain with sexual intercourse.  Document Released: 04/06/2004 Document Revised: 02/16/2011 Document Reviewed: 11/12/2008 [facility] Patient Information 2012 [place], [place].

## 2011-06-17 NOTE — ED Provider Notes (Signed)
Agree with exam, assessment, and plan.   Lattie Haw, MD 06/17/11 1504

## 2011-06-17 NOTE — ED Provider Notes (Signed)
History     CSN: [id number]  Arrival date & time 06/17/11  1115   None     Chief Complaint  Patient presents with  . Vaginal Discharge  . Nasal Congestion    (Consider location/radiation/quality/duration/timing/severity/associated sxs/prior treatment) Patient is a 36 y.o. female presenting with vaginal discharge.  Vaginal Discharge   36 y.o. female complains of thin yellow vaginal discharge for 1.6 weeks associated with no odor. Denies abnormal vaginal bleeding, significant pelvic pain or fever. No UTI symptoms. Sexually active, does not use condoms.  Last unprotected intercourse 1 week ago.  Denies history of known exposure to STD or symptoms in partner.  Patient's last menstrual period was 05/28/2011.   Past Medical History  Diagnosis Date  . Anxiety     Past Surgical History  Procedure Date  . Tubal ligation     Family History  Problem Relation Age of Onset  . Diabetes Mother   . Hypertension Mother   . Cancer Mother     breast    History  Substance Use Topics  . Smoking status: Current Everyday Smoker -- 1.0 packs/day  . Smokeless tobacco: Not on file  . Alcohol Use: Yes    OB History    Grav Para Term Preterm Abortions TAB SAB Ect Mult Living                  Review of Systems  Genitourinary: Positive for vaginal discharge.  All other systems reviewed and are negative.    Allergies  Review of patient's allergies indicates no known allergies.  Home Medications   Current Outpatient Rx  Name Route Sig Dispense Refill  . ALPRAZOLAM 0.25 MG PO TABS Oral Take 0.25 mg by mouth as needed. anxiety     . BEE POLLEN 1000 MG PO TABS Oral Take 1 tablet by mouth daily.      Marland Kitchen OVER THE COUNTER MEDICATION Oral Take 1 tablet by mouth daily. Green Coffee Bean       BP 127/85  Pulse 98  Temp(Src) 98.3 F (36.8 C) (Oral)  Resp 20  Ht 5\' 1"  (1.549 m)  Wt 195 lb (88.451 kg)  BMI 36.84 kg/m2  SpO2 100%  LMP 05/28/2011  Physical Exam  Constitutional:  She is oriented to person, place, and time. Vital signs are normal. She appears well-developed and well-nourished. She is active and cooperative.  HENT:  Head: Normocephalic.  Eyes: Conjunctivae are normal. Pupils are equal, round, and reactive to light. No scleral icterus.  Neck: Trachea normal. Neck supple.  Cardiovascular: Normal rate and regular rhythm.   Pulmonary/Chest: Effort normal and breath sounds normal.  Genitourinary: Vagina normal and uterus normal. Cervix exhibits no motion tenderness, no discharge and no friability. Right adnexum displays no tenderness. Left adnexum displays no tenderness.  Lymphadenopathy:       Right: No inguinal adenopathy present.       Left: No inguinal adenopathy present.  Neurological: She is alert and oriented to person, place, and time. No cranial nerve deficit or sensory deficit.  Skin: Skin is warm and dry.  Psychiatric: She has a normal mood and affect. Her speech is normal and behavior is normal. Judgment and thought content normal. Cognition and memory are normal.    ED Course  Procedures (including critical care time)   Labs Reviewed  POCT WET + KOH PREP (DR PERFORMED @ [facility])  pH 5, no trich, yeast or clue cells No results found.   1. Vaginitis   2. Vaginal  itching       MDM  Wet prep WNL GC and chlamydia DNA  probe sent to lab. Treatment: Warm soaks, dry area well, await cultures, condoms for STI prevention. ROV prn if symptoms persist or worsen.        Johnsie Kindred, NP 06/17/11 1212

## 2011-06-19 LAB — GC/CHLAMYDIA PROBE AMP, GENITAL
Chlamydia, DNA Probe: NEGATIVE
GC Probe Amp, Genital: NEGATIVE

## 2011-10-24 ENCOUNTER — Emergency Department
Admission: EM | Admit: 2011-10-24 | Discharge: 2011-10-24 | Disposition: A | Discharge status: Home / Self Care | Payer: Medicaid Other | Source: Home / Self Care | Attending: Family Medicine | Admitting: Family Medicine

## 2011-10-24 ENCOUNTER — Encounter: Payer: Self-pay | Admitting: *Deleted

## 2011-10-24 DIAGNOSIS — B9689 Other specified bacterial agents as the cause of diseases classified elsewhere: Secondary | ICD-10-CM

## 2011-10-24 DIAGNOSIS — R05 Cough: Secondary | ICD-10-CM

## 2011-10-24 DIAGNOSIS — N760 Acute vaginitis: Secondary | ICD-10-CM

## 2011-10-24 DIAGNOSIS — A499 Bacterial infection, unspecified: Secondary | ICD-10-CM

## 2011-10-24 DIAGNOSIS — R059 Cough, unspecified: Secondary | ICD-10-CM

## 2011-10-24 DIAGNOSIS — J309 Allergic rhinitis, unspecified: Secondary | ICD-10-CM

## 2011-10-24 MED ORDER — OMEPRAZOLE 20 MG PO CPDR: DELAYED_RELEASE_CAPSULE | ORAL | Status: DC

## 2011-10-24 MED ORDER — METRONIDAZOLE 500 MG PO TABS: 500.0000 mg | ORAL_TABLET | Freq: Two times a day (BID) | ORAL | Status: AC

## 2011-10-24 NOTE — ED Notes (Signed)
Pt c/o a non-productive and productive cough x 2 months, worse in the morning and at night. Denies fever. She has taken Norel with no relief.  She also c/o yellow vaginal discharge x 1-2 wks. She has a hx of BV and yeast infection in the past.

## 2011-10-24 NOTE — ED Provider Notes (Signed)
History     CSN: [id number]  Arrival date & time 10/24/11  1804   First MD Initiated Contact with Patient 10/24/11 1827      Chief Complaint  Patient presents with  . Cough  . Vaginal Discharge      HPI Comments: Patient presents with three problems: 1)  Pt c/o a non-productive and productive cough x 2 months, worse in the morning and at night. Denies fever. She has taken Norel with no relief.  No pleuritic pain or shortness of breath.  Her cough is generally worse in the morning and at night.  Cough is non-productive during the day.  She notes occasional heartburn.  2)  She has recurring sinus congestion, worse during change of seasons, sometimes improved with a decongestant.  She has RX for Dymista nasal spray but uses it intermittently, and only once per day.  3)  She also c/o yellow vaginal discharge x 1-2 wks. She has a hx of BV and yeast infection in the past.  She denies pelvic or abdominal pain.  No urinary symptoms.  She has not yet had her annual exam.  Patient is a 36 y.o. female presenting with cough. The history is provided by the patient.  Cough Episode onset: 2 months ago. Episode frequency: randomly. The problem has not changed since onset.The cough is non-productive. There has been no fever. Associated symptoms include rhinorrhea. Pertinent negatives include no chest pain, no chills, no sweats, no weight loss, no ear congestion, no ear pain, no headaches, no sore throat, no myalgias, no shortness of breath, no wheezing and no eye redness. She has tried nothing for the symptoms. She is a smoker. Her past medical history does not include asthma.    Past Medical History  Diagnosis Date  . Anxiety     Past Surgical History  Procedure Date  . Tubal ligation     Family History  Problem Relation Age of Onset  . Diabetes Mother   . Hypertension Mother   . Cancer Mother     breast    History  Substance Use Topics  . Smoking status: Current Everyday Smoker -- 0.5  packs/day  . Smokeless tobacco: Not on file  . Alcohol Use: Yes    OB History    Grav Para Term Preterm Abortions TAB SAB Ect Mult Living                  Review of Systems  Constitutional: Negative for fever, chills, weight loss, fatigue and unexpected weight change.  HENT: Positive for rhinorrhea and postnasal drip. Negative for ear pain, sore throat and sinus pressure.   Eyes: Negative.  Negative for redness.  Respiratory: Positive for cough. Negative for shortness of breath and wheezing.   Cardiovascular: Negative for chest pain.  Gastrointestinal: Negative for nausea and abdominal pain.  Genitourinary: Positive for vaginal discharge. Negative for dysuria, urgency, frequency, vaginal pain, menstrual problem and pelvic pain.  Musculoskeletal: Negative for myalgias.  Skin: Negative.   Neurological: Negative for headaches.    Allergies  Review of patient's allergies indicates no known allergies.  Home Medications   Current Outpatient Rx  Name Route Sig Dispense Refill  . ALPRAZOLAM 0.25 MG PO TABS Oral Take 0.25 mg by mouth as needed. anxiety     . BEE POLLEN 1000 MG PO TABS Oral Take 1 tablet by mouth daily.      Marland Kitchen METRONIDAZOLE 500 MG PO TABS Oral Take 1 tablet (500 mg total) by mouth 2 (  two) times daily. 14 tablet 0  . OMEPRAZOLE 20 MG PO CPDR  Take one cap each evening 30 minutes before a meal 30 capsule 1  . OVER THE COUNTER MEDICATION Oral Take 1 tablet by mouth daily. Green Coffee Bean       BP 145/92  Pulse 108  Temp 98.7 F (37.1 C) (Oral)  Resp 18  Ht 5\' 1"  (1.549 m)  Wt 190 lb 4 oz (86.297 kg)  BMI 35.95 kg/m2  SpO2 100%  LMP 09/23/2011  Physical Exam Nursing notes and Vital Signs reviewed. Appearance:  Patient appears healthy, stated age, and in no acute distress Eyes:  Pupils are equal, round, and reactive to light and accomodation.  Extraocular movement is intact.  Conjunctivae are not inflamed  Ears:  Canals normal.  Tympanic membranes normal.    Nose:  Mildly congested turbinates.  No sinus tenderness.   Mouth:  No lesions Pharynx:  Normal Neck:  Supple.   No adenopathy Lungs:  Clear to auscultation.  Breath sounds are equal.  Heart:  Regular rate and rhythm without murmurs, rubs, or gallops.  Abdomen:  Nontender without masses or hepatosplenomegaly.  Bowel sounds are present.  No CVA or flank tenderness.  Extremities:  No edema.  No calf tenderness Skin:  No rash present.   Genitourinary:  Vulva appears normal without lesions or erythema.  Vagina has normal mucosae without lesions.  There is a small amount of white discharge in the vaginal vault.  Cervix appears normal without lesions.  There is no discharge present in the cervical os.  No cervical motion tenderness is present.    Uterus is small and non-tender.  Adnexae are non-tender without masses.  ED Course  Procedures  none  Labs Reviewed - POCT Wet Prep/KOH:  Positive clue cells (2 out of 5), rare WBC, no yeast, no trich, pH 6    1. Cough;  Suspect GERD   2. Allergic rhinitis   3. Bacterial vaginosis       MDM  Begin Flagyl. Trial of omeprazole at bedtime.  Discussed reflux precautions Continue Dymista, but increase to full prescribed dose.  Encouraged to take regularly.  Followup with family doctor for annual exam        Lattie Haw, MD 10/26/11 [phone]

## 2011-10-28 ENCOUNTER — Telehealth: Payer: Self-pay

## 2011-10-28 NOTE — ED Notes (Signed)
I called and spoke with patient and she is doing better. I advised to call back if anything changes or if she has questions or concerns.  

## 2012-07-01 ENCOUNTER — Emergency Department (HOSPITAL_COMMUNITY): Payer: Medicaid Other

## 2012-07-01 ENCOUNTER — Emergency Department (HOSPITAL_COMMUNITY)
Admission: EM | Admit: 2012-07-01 | Discharge: 2012-07-01 | Disposition: A | Discharge status: Home / Self Care | Payer: Medicaid Other | Attending: Emergency Medicine | Admitting: Emergency Medicine

## 2012-07-01 ENCOUNTER — Encounter (HOSPITAL_COMMUNITY): Payer: Self-pay | Admitting: *Deleted

## 2012-07-01 DIAGNOSIS — F17200 Nicotine dependence, unspecified, uncomplicated: Secondary | ICD-10-CM | POA: Insufficient documentation

## 2012-07-01 DIAGNOSIS — I10 Essential (primary) hypertension: Secondary | ICD-10-CM | POA: Insufficient documentation

## 2012-07-01 DIAGNOSIS — F411 Generalized anxiety disorder: Secondary | ICD-10-CM | POA: Insufficient documentation

## 2012-07-01 DIAGNOSIS — R51 Headache: Secondary | ICD-10-CM

## 2012-07-01 DIAGNOSIS — Z79899 Other long term (current) drug therapy: Secondary | ICD-10-CM | POA: Insufficient documentation

## 2012-07-01 HISTORY — DX: Essential (primary) hypertension: I10

## 2012-07-01 MED ORDER — PROCHLORPERAZINE EDISYLATE 5 MG/ML IJ SOLN
10.0000 mg | Freq: Once | INTRAMUSCULAR | Status: AC
  Administered 2012-07-01: 10 mg via INTRAVENOUS
  Filled 2012-07-01: qty 2

## 2012-07-01 MED ORDER — SODIUM CHLORIDE 0.9 % IV SOLN
Freq: Once | INTRAVENOUS | Status: AC
  Administered 2012-07-01: 10:00:00 via INTRAVENOUS

## 2012-07-01 MED ORDER — DIPHENHYDRAMINE HCL 50 MG/ML IJ SOLN
25.0000 mg | Freq: Once | INTRAMUSCULAR | Status: AC
  Administered 2012-07-01: 25 mg via INTRAVENOUS
  Filled 2012-07-01: qty 1

## 2012-07-01 NOTE — ED Notes (Signed)
Patient is resting comfortably. States 'my head feels a little bit better'.

## 2012-07-01 NOTE — ED Notes (Signed)
Discharge instructions reviewed. Pt verbalized understanding.  

## 2012-07-01 NOTE — ED Provider Notes (Signed)
History     CSN: [id number]  Arrival date & time 07/01/12  0803   First MD Initiated Contact with Patient 07/01/12 0825      Chief Complaint  Patient presents with  . Headache    (Consider location/radiation/quality/duration/timing/severity/associated sxs/prior treatment) HPI  Patient is a 37 year old female past medical history significant for anxiety presenting to the ED for 4 days of headaches that began Saturday. Patient states she was sitting down when the headache came on suddenly with one-sided sharp pain around her right eye with associated numbness in bilateral fingertips and on her right cheek. Also with associated blurred vision in right eye that resolves with headache resolving. Patient states pain started 10 out of 10. EMS was called and she did not come to the emergency room at that time. Patient states the headache has come and gone over the last few days and not waking her up at night. Patient is not tried anything for relief of pain. Some associated photophobia and phonophobia aggravating the headache. Associated dizziness but no loss of consciousness or syncopal episode. Patient denies any trauma to the head. Patient states she's had headaches in the past hasn't had one for over at least a year, has never been worked up by PCP or neurologist for headaches. Patient had unrelated to episodes of fluttering heart sensation walking up stairs on Sunday sensation past and has not been present since. Patient denies fevers, chills, nausea, vomiting, difficulty ambulating, abdominal pain.  Past Medical History  Diagnosis Date  . Anxiety   . Hypertension     Past Surgical History  Procedure Laterality Date  . Tubal ligation      Family History  Problem Relation Age of Onset  . Diabetes Mother   . Hypertension Mother   . Cancer Mother     breast    History  Substance Use Topics  . Smoking status: Current Every Day Smoker -- 0.50 packs/day  . Smokeless tobacco: Not on  file  . Alcohol Use: Yes    OB History   Grav Para Term Preterm Abortions TAB SAB Ect Mult Living                  Review of Systems  Constitutional: Negative.   HENT: Negative for neck pain and neck stiffness.   Eyes: Positive for photophobia.  Respiratory: Negative.   Cardiovascular: Positive for palpitations.  Gastrointestinal: Negative.   Genitourinary: Negative.   Musculoskeletal: Negative.   Skin: Negative.   Neurological: Positive for dizziness, numbness and headaches.  Psychiatric/Behavioral: The patient is nervous/anxious.     Allergies  Review of patient's allergies indicates no known allergies.  Home Medications   Current Outpatient Rx  Name  Route  Sig  Dispense  Refill  . ALPRAZolam (XANAX) 0.25 MG tablet   Oral   Take 0.25 mg by mouth 2 (two) times daily as needed for anxiety. anxiety         . lisinopril (PRINIVIL,ZESTRIL) 5 MG tablet   Oral   Take 5 mg by mouth daily.           BP 138/94  Pulse 95  Temp(Src) 98 F (36.7 C) (Oral)  Resp 18  SpO2 100%  Physical Exam  Constitutional: She is oriented to person, place, and time. She appears well-developed and well-nourished. No distress.  HENT:  Head: Normocephalic and atraumatic.  Mouth/Throat: Oropharynx is clear and moist.  Eyes: Conjunctivae and EOM are normal. Pupils are equal, round, and reactive to light.  Neck: Neck supple.  Cardiovascular: Normal rate, regular rhythm and normal heart sounds.   Pulmonary/Chest: Effort normal and breath sounds normal. No respiratory distress. She has no wheezes. She has no rales. She exhibits no tenderness.  Abdominal: Soft. Bowel sounds are normal. There is no tenderness.  Musculoskeletal: Normal range of motion.  Lymphadenopathy:    She has no cervical adenopathy.  Neurological: She is alert and oriented to person, place, and time. She has normal strength. No cranial nerve deficit or sensory deficit. Coordination and gait normal.  Skin: Skin is  warm and dry. No rash noted. She is not diaphoretic.  Psychiatric: She has a normal mood and affect.    ED Course  Procedures (including critical care time)   Date: 07/01/2012  Rate: 91  Rhythm: normal sinus rhythm  QRS Axis: normal  Intervals: normal  ST/T Wave abnormalities: nonspecific T wave changes  Conduction Disutrbances:none  Narrative Interpretation:   Old EKG Reviewed: none available    Labs Reviewed - No data to display Ct Head Wo Contrast  07/01/2012  *RADIOLOGY REPORT*  Clinical Data: Headache and palpitations.  CT HEAD WITHOUT CONTRAST  Technique:  Contiguous axial images were obtained from the base of the skull through the vertex without contrast.  Comparison: None.  Findings: No acute intracranial abnormality is present. Specifically, there is no evidence for acute infarct, hemorrhage, mass, hydrocephalus, or extra-axial fluid collection.  The paranasal sinuses and mastoid air cells are clear.  The globes and orbits are intact.  The osseous skull is intact.  IMPRESSION: Negative CT of the head.   Original Report Authenticated By: Marin Roberts, M.D.      1. Headache       MDM  Pt HA treated and improved while in ED.  Presentation and imaging is non concerning for [facility], ICH, Meningitis, or temporal arteritis. Pt is afebrile with no focal neuro deficits, nuchal rigidity, or change in vision. Pt is to follow up with PCP to discuss prophylactic medication. Pt verbalizes understanding and is agreeable with plan to dc. Patient d/w with Dr. Effie Shy, agrees with plan. Patient is stable at time of discharge.             Jeannetta Ellis, PA-C 07/01/12 1232

## 2012-07-01 NOTE — ED Notes (Signed)
To ED for eval of HA and bilateral hand tingling since Saturday. Also c/o palpitations since Saturday. No neuro deficits. HA has been off and on. Unknown what makes HA worse or better.

## 2012-07-01 NOTE — ED Provider Notes (Signed)
Medical screening examination/treatment/procedure(s) were performed by non-physician practitioner and as supervising physician I was immediately available for consultation/collaboration.  Flint Melter, MD 07/01/12 1739

## 2012-09-19 ENCOUNTER — Other Ambulatory Visit (HOSPITAL_COMMUNITY)
Admission: RE | Admit: 2012-09-19 | Discharge: 2012-09-19 | Disposition: A | Discharge status: Home / Self Care | Payer: Medicaid Other | Source: Ambulatory Visit | Attending: Obstetrics & Gynecology | Admitting: Obstetrics & Gynecology

## 2012-09-19 ENCOUNTER — Encounter: Payer: Self-pay | Admitting: Obstetrics & Gynecology

## 2012-09-19 ENCOUNTER — Ambulatory Visit (INDEPENDENT_AMBULATORY_CARE_PROVIDER_SITE_OTHER): Payer: Medicaid Other | Admitting: Obstetrics & Gynecology

## 2012-09-19 VITALS — BP 119/81 | HR 106 | Resp 16 | Ht 61.5 in | Wt 195.0 lb

## 2012-09-19 DIAGNOSIS — Z1151 Encounter for screening for human papillomavirus (HPV): Secondary | ICD-10-CM | POA: Insufficient documentation

## 2012-09-19 DIAGNOSIS — Z01419 Encounter for gynecological examination (general) (routine) without abnormal findings: Secondary | ICD-10-CM | POA: Insufficient documentation

## 2012-09-19 DIAGNOSIS — Z7251 High risk heterosexual behavior: Secondary | ICD-10-CM

## 2012-09-19 DIAGNOSIS — Z113 Encounter for screening for infections with a predominantly sexual mode of transmission: Secondary | ICD-10-CM | POA: Insufficient documentation

## 2012-09-19 NOTE — Progress Notes (Signed)
Subjective:    Alexandra [name] is a 37 y.o. female who presents for an annual exam. The patient has no complaints today. She would like STI testing. The patient is sexually active. GYN screening history: last pap: was normal. The patient wears seatbelts: yes. The patient participates in regular exercise: no. Has the patient ever been transfused or tattooed?: no. The patient reports that there is not domestic violence in her life.   Menstrual History: OB History   Grav Para Term Preterm Abortions TAB SAB Ect Mult Living   2 2 2       2       Menarche age: 68  Patient's last menstrual period was 09/02/2012.    The following portions of the patient's history were reviewed and updated as appropriate: allergies, current medications, past family history, past medical history, past social history, past surgical history and problem list.  Review of Systems A comprehensive review of systems was negative.    Objective:    BP 119/81  Pulse 106  Resp 16  Ht 5' 1.5" (1.562 m)  Wt 195 lb (88.451 kg)  BMI 36.25 kg/m2  LMP 09/02/2012 General appearance: alert Head: Normocephalic, without obvious abnormality, atraumatic Lungs: clear to auscultation bilaterally Breasts: normal appearance, no masses or tenderness Heart: regular rate and rhythm, S1, S2 normal, no murmur, click, rub or gallop Abdomen: soft, non-tender; bowel sounds normal; no masses,  no organomegaly Pelvic: cervix normal in appearance, external genitalia normal, no adnexal masses or tenderness, no cervical motion tenderness, rectovaginal septum normal, uterus normal size, shape, and consistency and vagina normal without discharge.    Assessment:    Healthy female exam.    Plan:     Breast self exam technique reviewed and patient encouraged to perform self-exam monthly. Chlamydia specimen. GC specimen. Thin prep Pap smear. with HPV cotesting STI tests per patient request

## 2012-09-20 LAB — HIV ANTIBODY (ROUTINE TESTING W REFLEX): HIV: NONREACTIVE

## 2012-09-20 LAB — HEPATITIS B SURFACE ANTIGEN: Hepatitis B Surface Ag: NEGATIVE

## 2012-09-23 ENCOUNTER — Telehealth: Payer: Self-pay | Admitting: *Deleted

## 2012-09-23 NOTE — Telephone Encounter (Signed)
Pt notified of recent labs and per pt's request a copy faxed to her.

## 2012-11-17 ENCOUNTER — Encounter (HOSPITAL_COMMUNITY): Payer: Self-pay | Admitting: *Deleted

## 2012-11-17 ENCOUNTER — Emergency Department (HOSPITAL_COMMUNITY): Payer: Medicaid Other

## 2012-11-17 ENCOUNTER — Emergency Department (HOSPITAL_COMMUNITY)
Admission: EM | Admit: 2012-11-17 | Discharge: 2012-11-17 | Disposition: A | Discharge status: Home / Self Care | Payer: Medicaid Other | Attending: Emergency Medicine | Admitting: Emergency Medicine

## 2012-11-17 DIAGNOSIS — E669 Obesity, unspecified: Secondary | ICD-10-CM | POA: Insufficient documentation

## 2012-11-17 DIAGNOSIS — S39012A Strain of muscle, fascia and tendon of lower back, initial encounter: Secondary | ICD-10-CM

## 2012-11-17 DIAGNOSIS — Z79899 Other long term (current) drug therapy: Secondary | ICD-10-CM | POA: Insufficient documentation

## 2012-11-17 DIAGNOSIS — S335XXA Sprain of ligaments of lumbar spine, initial encounter: Secondary | ICD-10-CM | POA: Insufficient documentation

## 2012-11-17 DIAGNOSIS — F17200 Nicotine dependence, unspecified, uncomplicated: Secondary | ICD-10-CM | POA: Insufficient documentation

## 2012-11-17 DIAGNOSIS — S301XXA Contusion of abdominal wall, initial encounter: Secondary | ICD-10-CM | POA: Insufficient documentation

## 2012-11-17 DIAGNOSIS — S139XXA Sprain of joints and ligaments of unspecified parts of neck, initial encounter: Secondary | ICD-10-CM | POA: Insufficient documentation

## 2012-11-17 DIAGNOSIS — Y9389 Activity, other specified: Secondary | ICD-10-CM | POA: Insufficient documentation

## 2012-11-17 DIAGNOSIS — S20219A Contusion of unspecified front wall of thorax, initial encounter: Secondary | ICD-10-CM | POA: Insufficient documentation

## 2012-11-17 DIAGNOSIS — Y92410 Unspecified street and highway as the place of occurrence of the external cause: Secondary | ICD-10-CM | POA: Insufficient documentation

## 2012-11-17 DIAGNOSIS — F411 Generalized anxiety disorder: Secondary | ICD-10-CM | POA: Insufficient documentation

## 2012-11-17 DIAGNOSIS — I10 Essential (primary) hypertension: Secondary | ICD-10-CM | POA: Insufficient documentation

## 2012-11-17 DIAGNOSIS — S161XXA Strain of muscle, fascia and tendon at neck level, initial encounter: Secondary | ICD-10-CM

## 2012-11-17 LAB — URINALYSIS, ROUTINE W REFLEX MICROSCOPIC
Bilirubin Urine: NEGATIVE
Ketones, ur: NEGATIVE mg/dL
Nitrite: NEGATIVE
Urobilinogen, UA: 1 mg/dL (ref 0.0–1.0)
pH: 6.5 (ref 5.0–8.0)

## 2012-11-17 LAB — CBC WITH DIFFERENTIAL/PLATELET
Basophils Relative: 0 % (ref 0–1)
Eosinophils Absolute: 0.1 10*3/uL (ref 0.0–0.7)
Eosinophils Relative: 1 % (ref 0–5)
Hemoglobin: 13.4 g/dL (ref 12.0–15.0)
MCH: 31.1 pg (ref 26.0–34.0)
MCHC: 33.3 g/dL (ref 30.0–36.0)
MCV: 93.5 fL (ref 78.0–100.0)
Monocytes Absolute: 0.5 10*3/uL (ref 0.1–1.0)
Monocytes Relative: 4 % (ref 3–12)
Neutrophils Relative %: 72 % (ref 43–77)

## 2012-11-17 MED ORDER — OXYCODONE-ACETAMINOPHEN 5-325 MG PO TABS: 1.0000 | ORAL_TABLET | ORAL | Status: DC | PRN

## 2012-11-17 MED ORDER — OXYCODONE-ACETAMINOPHEN 5-325 MG PO TABS
1.0000 | ORAL_TABLET | Freq: Once | ORAL | Status: AC
  Administered 2012-11-17: 1 via ORAL
  Filled 2012-11-17: qty 1

## 2012-11-17 MED ORDER — CYCLOBENZAPRINE HCL 10 MG PO TABS: 10.0000 mg | ORAL_TABLET | Freq: Three times a day (TID) | ORAL | Status: DC | PRN
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
## 2012-11-17 NOTE — ED Notes (Signed)
Patient resting. Awaiting radiology scan results.

## 2012-11-17 NOTE — ED Notes (Signed)
Pt has abdominal pain and reports bruising
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
## 2013-02-19 ENCOUNTER — Ambulatory Visit: Payer: Self-pay | Admitting: Obstetrics & Gynecology

## 2013-06-06 ENCOUNTER — Other Ambulatory Visit: Payer: Self-pay | Admitting: Family

## 2013-06-06 ENCOUNTER — Ambulatory Visit
Admission: RE | Admit: 2013-06-06 | Discharge: 2013-06-06 | Disposition: A | Discharge status: Home / Self Care | Payer: No Typology Code available for payment source | Source: Ambulatory Visit | Attending: Family | Admitting: Family

## 2013-06-06 DIAGNOSIS — M545 Low back pain, unspecified: Secondary | ICD-10-CM

## 2014-01-12 ENCOUNTER — Encounter (HOSPITAL_COMMUNITY): Payer: Self-pay | Admitting: *Deleted

## 2014-04-16 ENCOUNTER — Emergency Department (HOSPITAL_COMMUNITY)
Admission: EM | Admit: 2014-04-16 | Discharge: 2014-04-16 | Disposition: A | Discharge status: Home / Self Care | Payer: BLUE CROSS/BLUE SHIELD | Source: Home / Self Care | Attending: Family Medicine | Admitting: Family Medicine

## 2014-04-16 ENCOUNTER — Encounter (HOSPITAL_COMMUNITY): Payer: Self-pay | Admitting: Emergency Medicine

## 2014-04-16 ENCOUNTER — Other Ambulatory Visit (HOSPITAL_COMMUNITY)
Admission: RE | Admit: 2014-04-16 | Discharge: 2014-04-16 | Disposition: A | Discharge status: Home / Self Care | Payer: BLUE CROSS/BLUE SHIELD | Source: Ambulatory Visit | Attending: Family Medicine | Admitting: Family Medicine

## 2014-04-16 DIAGNOSIS — N760 Acute vaginitis: Secondary | ICD-10-CM

## 2014-04-16 DIAGNOSIS — Z113 Encounter for screening for infections with a predominantly sexual mode of transmission: Secondary | ICD-10-CM | POA: Diagnosis not present

## 2014-04-16 LAB — POCT URINALYSIS DIP (DEVICE)
Bilirubin Urine: NEGATIVE
Glucose, UA: 100 mg/dL — AB
HGB URINE DIPSTICK: NEGATIVE
Ketones, ur: NEGATIVE mg/dL
Leukocytes, UA: NEGATIVE
NITRITE: NEGATIVE
PH: 5.5 (ref 5.0–8.0)
PROTEIN: NEGATIVE mg/dL
Urobilinogen, UA: 1 mg/dL (ref 0.0–1.0)

## 2014-04-16 LAB — POCT PREGNANCY, URINE: PREG TEST UR: NEGATIVE

## 2014-04-16 NOTE — ED Notes (Signed)
C/o vaginal discharge no odor for two to three weeks.Marland Kitchen.   Hx of recurrent yeast and BV.   Did one day treatment with no relief.  And used 3 days dose of otc meds.

## 2014-04-16 NOTE — Discharge Instructions (Signed)
Thank you for coming in today. I will call with results and: Medications as needed  Vaginitis Vaginitis is an inflammation of the vagina. It is most often caused by a change in the normal balance of the bacteria and yeast that live in the vagina. This change in balance causes an overgrowth of certain bacteria or yeast, which causes the inflammation. There are different types of vaginitis, but the most common types are:  Bacterial vaginosis.  Yeast infection (candidiasis).  Trichomoniasis vaginitis. This is a sexually transmitted infection (STI).  Viral vaginitis.  Atropic vaginitis.  Allergic vaginitis. CAUSES  The cause depends on the type of vaginitis. Vaginitis can be caused by:  Bacteria (bacterial vaginosis).  Yeast (yeast infection).  A parasite (trichomoniasis vaginitis)  A virus (viral vaginitis).  Low hormone levels (atrophic vaginitis). Low hormone levels can occur during pregnancy, breastfeeding, or after menopause.  Irritants, such as bubble baths, scented tampons, and feminine sprays (allergic vaginitis). Other factors can change the normal balance of the yeast and bacteria that live in the vagina. These include:  Antibiotic medicines.  Poor hygiene.  Diaphragms, vaginal sponges, spermicides, birth control pills, and intrauterine devices (IUD).  Sexual intercourse.  Infection.  Uncontrolled diabetes.  A weakened immune system. SYMPTOMS  Symptoms can vary depending on the cause of the vaginitis. Common symptoms include:  Abnormal vaginal discharge.  The discharge is white, gray, or yellow with bacterial vaginosis.  The discharge is thick, white, and cheesy with a yeast infection.  The discharge is frothy and yellow or greenish with trichomoniasis.  A bad vaginal odor.  The odor is fishy with bacterial vaginosis.  Vaginal itching, pain, or swelling.  Painful intercourse.  Pain or burning when urinating. Sometimes, there are no  symptoms. TREATMENT  Treatment will vary depending on the type of infection.   Bacterial vaginosis and trichomoniasis are often treated with antibiotic creams or pills.  Yeast infections are often treated with antifungal medicines, such as vaginal creams or suppositories.  Viral vaginitis has no cure, but symptoms can be treated with medicines that relieve discomfort. Your sexual partner should be treated as well.  Atrophic vaginitis may be treated with an estrogen cream, pill, suppository, or vaginal ring. If vaginal dryness occurs, lubricants and moisturizing creams may help. You may be told to avoid scented soaps, sprays, or douches.  Allergic vaginitis treatment involves quitting the use of the product that is causing the problem. Vaginal creams can be used to treat the symptoms. HOME CARE INSTRUCTIONS   Take all medicines as directed by your caregiver.  Keep your genital area clean and dry. Avoid soap and only rinse the area with water.  Avoid douching. It can remove the healthy bacteria in the vagina.  Do not use tampons or have sexual intercourse until your vaginitis has been treated. Use sanitary pads while you have vaginitis.  Wipe from front to back. This avoids the spread of bacteria from the rectum to the vagina.  Let air reach your genital area.  Wear cotton underwear to decrease moisture buildup.  Avoid wearing underwear while you sleep until your vaginitis is gone.  Avoid tight pants and underwear or nylons without a cotton panel.  Take off wet clothing (especially bathing suits) as soon as possible.  Use mild, non-scented products. Avoid using irritants, such as:  Scented feminine sprays.  Fabric softeners.  Scented detergents.  Scented tampons.  Scented soaps or bubble baths.  Practice safe sex and use condoms. Condoms may prevent the spread of  trichomoniasis and viral vaginitis. SEEK MEDICAL CARE IF:   You have abdominal pain.  You have a fever or  persistent symptoms for more than 2-3 days.  You have a fever and your symptoms suddenly get worse. Document Released: 12/25/2006 Document Revised: 11/22/2011 Document Reviewed: 08/10/2011 [facility] Patient Information 2015 [place]. This information is not intended to replace advice given to you by your health care provider. Make sure you discuss any questions you have with your health care provider.

## 2014-04-16 NOTE — ED Provider Notes (Signed)
Alexandra [name] is a 39 y.o. female who presents to Urgent Care today for vaginal discharge present for 2-3 weeks. Patient has tried some over-the-counter yeast infection medicines and 3 days of oral metronidazole and not improved. No fevers or chills vomiting or diarrhea. No pelvic pain.   Past Medical History  Diagnosis Date  . Anxiety   . Hypertension   . Obesity    Past Surgical History  Procedure Laterality Date  . Tubal ligation     History  Substance Use Topics  . Smoking status: Current Every Day Smoker -- 0.50 packs/day  . Smokeless tobacco: Not on file  . Alcohol Use: Yes   ROS as above Medications: No current facility-administered medications for this encounter.   Current Outpatient Prescriptions  Medication Sig Dispense Refill  . ALPRAZolam (XANAX) 0.25 MG tablet Take 0.25 mg by mouth 2 (two) times daily as needed for anxiety.     . cyclobenzaprine (FLEXERIL) 10 MG tablet Take 1 tablet (10 mg total) by mouth 3 (three) times daily as needed for muscle spasms. 30 tablet 0  . lisinopril (PRINIVIL,ZESTRIL) 10 MG tablet Take 10 mg by mouth every evening.    . rosuvastatin (CRESTOR) 10 MG tablet Take 10 mg by mouth every evening.    Marland Kitchen. oxyCODONE-acetaminophen (PERCOCET) 5-325 MG per tablet Take 1 tablet by mouth every 4 (four) hours as needed for pain. 20 tablet 0   No Known Allergies   Exam:  BP 138/95 mmHg  Pulse 102  Temp(Src) 99 F (37.2 C) (Oral)  Resp 18  SpO2 99%  LMP 03/16/2014 Gen: Well NAD GYN: Normal external genitalia. Vaginal canal with thin white discharge. Normal-appearing cervix. Nontender.  Results for orders placed or performed during the hospital encounter of 04/16/14 (from the past 24 hour(s))  POCT urinalysis dip (device)     Status: Abnormal   Collection Time: 04/16/14  5:47 PM  Result Value Ref Range   Glucose, UA 100 (A) NEGATIVE mg/dL   Bilirubin Urine NEGATIVE NEGATIVE   Ketones, ur NEGATIVE NEGATIVE mg/dL   Specific Gravity, Urine  >=1.030 1.005 - 1.030   Hgb urine dipstick NEGATIVE NEGATIVE   pH 5.5 5.0 - 8.0   Protein, ur NEGATIVE NEGATIVE mg/dL   Urobilinogen, UA 1.0 0.0 - 1.0 mg/dL   Nitrite NEGATIVE NEGATIVE   Leukocytes, UA NEGATIVE NEGATIVE  Pregnancy, urine POC     Status: None   Collection Time: 04/16/14  5:53 PM  Result Value Ref Range   Preg Test, Ur NEGATIVE NEGATIVE   No results found.  Assessment and Plan: 39 y.o. female with vaginitis. Cytology pending for gonorrhea and Trichomonas chlamydia BV and yeast. Will call in appropriate treatment when results are back  Discussed warning signs or symptoms. Please see discharge instructions. Patient expresses understanding.     Rodolph BongEvan S Krystyna Cleckley, MD 04/16/14 [phone]

## 2014-04-17 LAB — CERVICOVAGINAL ANCILLARY ONLY
Chlamydia: NEGATIVE
NEISSERIA GONORRHEA: NEGATIVE

## 2014-04-20 LAB — CERVICOVAGINAL ANCILLARY ONLY
WET PREP (BD AFFIRM): NEGATIVE
Wet Prep (BD Affirm): NEGATIVE
Wet Prep (BD Affirm): POSITIVE — AB

## 2014-04-22 ENCOUNTER — Telehealth (HOSPITAL_COMMUNITY): Payer: Self-pay | Admitting: Family Medicine

## 2014-04-22 MED ORDER — METRONIDAZOLE 500 MG PO TABS: 500.0000 mg | ORAL_TABLET | Freq: Two times a day (BID) | ORAL | Status: DC

## 2014-04-22 NOTE — ED Notes (Signed)
BV positive.  Flagyl sent in .  RN to contact pt  Rodolph BongEvan S Lakia Gritton, MD 04/22/14 2054

## 2014-04-22 NOTE — ED Notes (Signed)
GC/Chlamydia neg., Affirm: Candida and Trich neg., Gardnerella pos.  Message sent to Dr. Denyse Amassorey. 04/22/2014

## 2014-04-23 ENCOUNTER — Telehealth (HOSPITAL_COMMUNITY): Payer: Self-pay | Admitting: *Deleted

## 2014-04-23 NOTE — ED Notes (Signed)
I called pt. Pt. verified x 2. Pt.sSaid she has already seen her results in my chart and just needs a Rx. I told her that Dr. Corey sent FlaDenyse Amassgyl to her pharmacy yesterday.   Pt. instructed to no alcohol while taking this medication.  Pt. had not questions. Vassie MoselleYork, Farah Lepak M 04/23/2014

## 2014-07-02 ENCOUNTER — Other Ambulatory Visit (HOSPITAL_COMMUNITY)
Admission: RE | Admit: 2014-07-02 | Discharge: 2014-07-02 | Disposition: A | Discharge status: Home / Self Care | Payer: BLUE CROSS/BLUE SHIELD | Source: Ambulatory Visit | Attending: Obstetrics & Gynecology | Admitting: Obstetrics & Gynecology

## 2014-07-02 ENCOUNTER — Other Ambulatory Visit: Payer: Self-pay | Admitting: Obstetrics & Gynecology

## 2014-07-02 DIAGNOSIS — Z01419 Encounter for gynecological examination (general) (routine) without abnormal findings: Secondary | ICD-10-CM | POA: Diagnosis present

## 2014-07-02 DIAGNOSIS — Z113 Encounter for screening for infections with a predominantly sexual mode of transmission: Secondary | ICD-10-CM | POA: Diagnosis present

## 2014-07-03 LAB — CYTOLOGY - PAP

## 2014-12-01 ENCOUNTER — Other Ambulatory Visit (HOSPITAL_COMMUNITY): Payer: Self-pay | Admitting: Obstetrics & Gynecology

## 2014-12-01 ENCOUNTER — Other Ambulatory Visit (INDEPENDENT_AMBULATORY_CARE_PROVIDER_SITE_OTHER): Payer: BLUE CROSS/BLUE SHIELD

## 2014-12-01 VITALS — BP 126/89 | HR 103 | Resp 16 | Ht 61.5 in | Wt 203.0 lb

## 2014-12-01 DIAGNOSIS — Z113 Encounter for screening for infections with a predominantly sexual mode of transmission: Secondary | ICD-10-CM | POA: Diagnosis not present

## 2014-12-01 DIAGNOSIS — N898 Other specified noninflammatory disorders of vagina: Secondary | ICD-10-CM

## 2014-12-01 NOTE — Progress Notes (Signed)
Pt here with c/o's vaginal d/c and itching.  She has recently been put on Aygestin for painful periods.  She is requesting GC/Chl;amydia and urinalysis also.  Will send labs to [facility] and call with results.

## 2014-12-02 ENCOUNTER — Telehealth: Payer: Self-pay | Admitting: *Deleted

## 2014-12-02 LAB — URINALYSIS
Bilirubin Urine: NEGATIVE
GLUCOSE, UA: NEGATIVE
Nitrite: POSITIVE — AB
PROTEIN: NEGATIVE
Specific Gravity, Urine: 1.029 (ref 1.001–1.035)
pH: 5.5 (ref 5.0–8.0)

## 2014-12-02 LAB — GC/CHLAMYDIA PROBE AMP
CT Probe RNA: NEGATIVE
GC Probe RNA: NEGATIVE

## 2014-12-03 ENCOUNTER — Telehealth: Payer: Self-pay | Admitting: *Deleted

## 2014-12-03 DIAGNOSIS — N390 Urinary tract infection, site not specified: Secondary | ICD-10-CM

## 2014-12-03 LAB — WET PREP BY MOLECULAR PROBE
Candida species: NEGATIVE
Gardnerella vaginalis: NEGATIVE
Trichomonas vaginosis: NEGATIVE

## 2014-12-03 MED ORDER — SULFAMETHOXAZOLE-TRIMETHOPRIM 800-160 MG PO TABS: 1.0000 | ORAL_TABLET | Freq: Two times a day (BID) | ORAL | Status: DC

## 2014-12-03 NOTE — Telephone Encounter (Signed)
Pt has a positive urinalysis from [facility] showing UTI, per protocol pt started on Septra DS and urinalysis sent for culture.

## 2014-12-03 NOTE — Telephone Encounter (Signed)
Erroneous encounter

## 2014-12-04 LAB — URINE CULTURE

## 2015-01-18 ENCOUNTER — Ambulatory Visit (INDEPENDENT_AMBULATORY_CARE_PROVIDER_SITE_OTHER): Payer: BLUE CROSS/BLUE SHIELD | Admitting: Family Medicine

## 2015-01-18 ENCOUNTER — Encounter: Payer: Self-pay | Admitting: Family Medicine

## 2015-01-18 ENCOUNTER — Other Ambulatory Visit: Payer: Self-pay | Admitting: Family Medicine

## 2015-01-18 VITALS — BP 122/85 | HR 103 | Resp 16 | Ht 61.5 in | Wt 203.0 lb

## 2015-01-18 DIAGNOSIS — N939 Abnormal uterine and vaginal bleeding, unspecified: Secondary | ICD-10-CM | POA: Diagnosis not present

## 2015-01-18 DIAGNOSIS — N898 Other specified noninflammatory disorders of vagina: Secondary | ICD-10-CM

## 2015-01-18 MED ORDER — CLOBETASOL PROPIONATE 0.05 % EX CREA: 1.0000 "application " | TOPICAL_CREAM | Freq: Two times a day (BID) | CUTANEOUS | Status: DC

## 2015-01-18 NOTE — Patient Instructions (Signed)

## 2015-01-18 NOTE — Progress Notes (Signed)
    Subjective:    Patient ID: Alexandra [name] is a 39 y.o. female presenting with Vaginal Itching  on 01/18/2015  HPI: Here in September and had vaginal itching. Thought to have UTI and treated.  Really no relief since then. No true cycle x 2 months, with continuous spotting.  Took Tindamax and keflex, hydrocortisone, OTC vaginal yeast cream and Diflucan and none of it has helped. Itching and irritation continue to happen.  Review of Systems  Constitutional: Negative for fever and chills.  Respiratory: Negative for shortness of breath.   Cardiovascular: Negative for chest pain.  Gastrointestinal: Negative for nausea, vomiting and abdominal pain.  Genitourinary: Negative for dysuria.  Skin: Negative for rash.      Objective:    BP 122/85 mmHg  Pulse 103  Resp 16  Ht 5' 1.5" (1.562 m)  Wt 203 lb (92.08 kg)  BMI 37.74 kg/m2 Physical Exam  Constitutional: She is oriented to person, place, and time. She appears well-developed and well-nourished. No distress.  HENT:  Head: Normocephalic and atraumatic.  Eyes: No scleral icterus.  Neck: Neck supple.  Cardiovascular: Normal rate.   Pulmonary/Chest: Effort normal.  Abdominal: Soft.  Genitourinary:  Small ulcer at the introitus in the midline. Some pallor noted. No vaginal discharge.  Dark blood in vault.  Neurological: She is alert and oriented to person, place, and time.  Skin: Skin is warm and dry.  Psychiatric: She has a normal mood and affect.        Assessment & Plan:   Problem List Items Addressed This Visit      Unprioritized   Abnormal uterine bleeding   Relevant Orders   WET PREP BY MOLECULAR PROBE   Herpes simplex virus culture    Other Visit Diagnoses    Vaginal irritation    -  Primary    Check cultures, trial of clobetasol.    Relevant Medications    clobetasol cream (TEMOVATE) 0.05 %    Other Relevant Orders    WET PREP BY MOLECULAR PROBE    Herpes simplex virus culture       Return if symptoms  worsen or fail to improve.  Emet Rafanan S 01/18/2015 1:19 PM

## 2015-01-19 LAB — WET PREP BY MOLECULAR PROBE
Candida species: NEGATIVE
Gardnerella vaginalis: NEGATIVE
Trichomonas vaginosis: NEGATIVE

## 2015-01-20 LAB — HERPES SIMPLEX VIRUS CULTURE: Organism ID, Bacteria: NOT DETECTED

## 2015-01-21 ENCOUNTER — Telehealth: Payer: Self-pay | Admitting: *Deleted

## 2015-01-21 NOTE — Telephone Encounter (Signed)
Called pt to adv normal lab results. Pt expressed understanding.

## 2015-02-18 ENCOUNTER — Ambulatory Visit: Payer: BLUE CROSS/BLUE SHIELD | Admitting: Family Medicine

## 2015-05-22 ENCOUNTER — Encounter: Payer: Self-pay | Admitting: Emergency Medicine

## 2015-05-22 ENCOUNTER — Emergency Department
Admission: EM | Admit: 2015-05-22 | Discharge: 2015-05-22 | Disposition: A | Discharge status: Home / Self Care | Payer: BLUE CROSS/BLUE SHIELD | Source: Home / Self Care | Attending: Family Medicine | Admitting: Family Medicine

## 2015-05-22 DIAGNOSIS — R21 Rash and other nonspecific skin eruption: Secondary | ICD-10-CM

## 2015-05-22 DIAGNOSIS — J040 Acute laryngitis: Secondary | ICD-10-CM

## 2015-05-22 DIAGNOSIS — J3089 Other allergic rhinitis: Secondary | ICD-10-CM | POA: Diagnosis not present

## 2015-05-22 MED ORDER — FLUTICASONE PROPIONATE 50 MCG/ACT NA SUSP: 2.0000 | Freq: Every day | NASAL | Status: DC

## 2015-05-22 MED ORDER — CETIRIZINE HCL 10 MG PO CAPS: 10.0000 mg | ORAL_CAPSULE | Freq: Every day | ORAL | Status: DC

## 2015-05-22 MED ORDER — PREDNISONE 20 MG PO TABS: ORAL_TABLET | ORAL | Status: DC

## 2015-05-22 NOTE — ED Provider Notes (Signed)
CSN: [id number]     Arrival date & time 05/22/15  1726 History   First MD Initiated Contact with Patient 05/22/15 1752     Chief Complaint  Patient presents with  . Otalgia  . Nasal Congestion  . 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  She reports taking an allergy medication from her PCP but does not recall name of medication.  She has also used Nasonex but states that has not helped. She does not wear a face mask when she works.  She has also had bilateral ear pain and noticed a mildly pruritic rash behind her left ear.  No known allergies.  Denies fever, chills, n/v/d. Denies throat swelling or difficulty breathing.   Past Medical History  Diagnosis Date  . Anxiety   . Hypertension   . Obesity    Past Surgical History  Procedure Laterality Date  . Tubal ligation     Family History  Problem Relation Age of Onset  . Diabetes Mother   . Hypertension Mother   . Cancer Mother     breast   Social History  Substance Use Topics  . Smoking status: Current Every Day Smoker -- 0.50 packs/day  . Smokeless tobacco: None  . Alcohol Use: Yes   OB History    Gravida Para Term Preterm AB TAB SAB Ectopic Multiple Living   2 2 2       2      Review of Systems  Constitutional: Negative for fever and chills.  HENT: Positive for congestion, ear pain ( bilateral), postnasal drip, rhinorrhea, sinus pressure, sneezing and voice change. Negative for sore throat and trouble swallowing.   Respiratory: Positive for cough. Negative for shortness of breath.   Cardiovascular: Negative for chest pain and palpitations.  Gastrointestinal: Negative for nausea, vomiting, abdominal pain and diarrhea.  Musculoskeletal: Negative for myalgias, back pain and  arthralgias.  Skin: Negative for rash.  Neurological: Positive for headaches. Negative for dizziness and light-headedness.    Allergies  Review of patient's allergies indicates no known allergies.  Home Medications   Prior to Admission medications   Medication Sig Start Date End Date Taking? Authorizing Provider  ALPRAZolam (XANAX) 0.25 MG tablet Take 0.25 mg by mouth 2 (two) times daily as needed for anxiety.     Historical Provider, MD  Cetirizine HCl 10 MG CAPS Take 1 capsule (10 mg total) by mouth daily. 05/22/15   Junius FinnerErin O'Malley, PA-C  clobetasol cream (TEMOVATE) 0.05 % Apply 1 application topically 2 (two) times daily. 01/18/15   Reva Boresanya S Pratt, MD  cyclobenzaprine (FLEXERIL) 10 MG tablet Take 1 tablet (10 mg total) by mouth 3 (three) times daily as needed for muscle spasms. 11/17/12   Dione Boozeavid Glick, MD  fluticasone [facility](FLONASE) 50 MCG/ACT nasal spray Place 2 sprays into both nostrils daily. 05/22/15   Junius FinnerErin O'Malley, PA-C  ibuprofen (ADVIL,MOTRIN) 800 MG tablet  11/05/14   Historical Provider, MD  levonorgestrel-ethinyl estradiol (NORDETTE) 0.15-30 MG-MCG tablet Take 1 tablet by mouth daily.    Historical Provider, MD  norethindrone (AYGESTIN) 5 MG tablet TAKE 1 TABLET BY MOUTH EVERY DAY IN THE MORNING 12/08/14   Historical Provider, MD  predniSONE (DELTASONE) 20 MG tablet 3 tabs po day one, then 2 po daily x 4 days 05/22/15   Junius FinnerErin O'Malley,  PA-C  rosuvastatin (CRESTOR) 10 MG tablet Take 10 mg by mouth every evening.    Historical Provider, MD  valsartan-hydrochlorothiazide (DIOVAN-HCT) 160-12.5 MG per tablet  10/26/14   Historical Provider, MD   Meds Ordered and Administered this Visit  Medications - No data to display  BP 129/84 mmHg  Pulse 83  Temp(Src) 98.6 F (37 C) (Oral)  Resp 16  Ht 5' 1.5" (1.562 m)  Wt 206 lb 12 oz (93.781 kg)  BMI 38.44 kg/m2  SpO2 100% No data found.   Physical Exam  Constitutional: She appears well-developed and well-nourished. No distress.  HENT:  Head:  Normocephalic and atraumatic.  Right Ear: Tympanic membrane normal.  Left Ear: Tympanic membrane normal.  Nose: Rhinorrhea present.  Mouth/Throat: Uvula is midline, oropharynx is clear and moist and mucous membranes are normal.  Eyes: Conjunctivae are normal. No scleral icterus.  Neck: Normal range of motion. Neck supple.  Hoarse voice but no stridor.  Cardiovascular: Normal rate, regular rhythm and normal heart sounds.   Pulmonary/Chest: Effort normal and breath sounds normal. No respiratory distress. She has no wheezes. She has no rales.  Abdominal: Soft. She exhibits no distension. There is no tenderness.  Musculoskeletal: Normal range of motion.  Neurological: She is alert.  Skin: Skin is warm and dry. Rash noted. She is not diaphoretic.  Faint erythematous papular dried rash behind Left ear. Non-tender. No bleeding or discharge.   Nursing note and vitals reviewed.   ED Course  Procedures (including critical care time)  Labs Review Labs Reviewed - No data to display  Imaging Review No results found.      MDM   1. Laryngitis   2. Other allergic rhinitis   3. Rash    Pt c/o hoarse voice, nasal congestion, and rash behind left ear after working in a dusty environment.    Symptoms c/w laryngitis but likely allergic involvement.   Rx: cetirizine, flonase, and prednisone.  Encouraged to wear a facemask while working in dust environment. F/u with PCP in 1 week if not improving. Patient verbalized understanding and agreement with treatment plan.     Junius Finner, PA-C 05/23/15 1113

## 2015-05-22 NOTE — ED Notes (Signed)
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 C/O Bilateral ear pain and Hoarseness denies fever

## 2015-11-03 ENCOUNTER — Emergency Department
Admission: EM | Admit: 2015-11-03 | Discharge: 2015-11-03 | Disposition: A | Discharge status: Home / Self Care | Payer: BLUE CROSS/BLUE SHIELD | Source: Home / Self Care | Attending: Family Medicine | Admitting: Family Medicine

## 2015-11-03 ENCOUNTER — Encounter: Payer: Self-pay | Admitting: *Deleted

## 2015-11-03 ENCOUNTER — Telehealth: Payer: Self-pay | Admitting: Family Medicine

## 2015-11-03 DIAGNOSIS — B373 Candidiasis of vulva and vagina: Secondary | ICD-10-CM | POA: Diagnosis not present

## 2015-11-03 DIAGNOSIS — A499 Bacterial infection, unspecified: Secondary | ICD-10-CM

## 2015-11-03 DIAGNOSIS — N760 Acute vaginitis: Secondary | ICD-10-CM

## 2015-11-03 DIAGNOSIS — B3731 Acute candidiasis of vulva and vagina: Secondary | ICD-10-CM

## 2015-11-03 DIAGNOSIS — N898 Other specified noninflammatory disorders of vagina: Secondary | ICD-10-CM | POA: Diagnosis not present

## 2015-11-03 DIAGNOSIS — B9689 Other specified bacterial agents as the cause of diseases classified elsewhere: Secondary | ICD-10-CM

## 2015-11-03 LAB — POCT URINALYSIS DIP (MANUAL ENTRY)
BILIRUBIN UA: NEGATIVE
Blood, UA: NEGATIVE
GLUCOSE UA: NEGATIVE
NITRITE UA: NEGATIVE
Protein Ur, POC: NEGATIVE
SPEC GRAV UA: 1.025 (ref 1.005–1.03)
UROBILINOGEN UA: 0.2 (ref 0–1)
pH, UA: 6 (ref 5–8)

## 2015-11-03 LAB — POCT WET PREP WITH KOH
CLUE CELLS WET PREP PER HPF POC: POSITIVE
KOH Prep POC: POSITIVE
Trichomonas, UA: NEGATIVE

## 2015-11-03 MED ORDER — METRONIDAZOLE 500 MG PO TABS: ORAL_TABLET | ORAL | 0 refills | Status: DC

## 2015-11-03 MED ORDER — FLUCONAZOLE 150 MG PO TABS: 150.0000 mg | ORAL_TABLET | Freq: Once | ORAL | 0 refills | Status: AC

## 2015-11-03 NOTE — Discharge Instructions (Signed)
Try taking probiotic FemDophilus (by [place]) for prevention of BV.

## 2015-11-03 NOTE — ED Triage Notes (Signed)
Pt c/o vaginal itching x 1 day. Denies pain or d/c.

## 2015-11-03 NOTE — ED Provider Notes (Signed)
[facility]    CSN: [id number] Arrival date & time: 11/03/15  0830  First Provider Contact:  First MD Initiated Contact with Patient 11/03/15 [phone]        History   Chief Complaint Chief Complaint  Patient presents with  . Vaginal Itching    HPI Alexandra [name] is a 40 y.o. female.   Patient complains of one week history of scant vaginal discharge and itching at her introitus.  She has had minimal dysuria.  No pelvic or abdominal pain.  No nausea/vomiting.  No fevers, chills, and sweats.  No LMP recorded. Patient is not currently having periods (Reason: Oral contraceptives).  She has had BV in the past and her present symptoms are similar.   The history is provided by the patient.  Vaginal Discharge  Quality:  Watery and thin Severity:  Mild Onset quality:  Gradual Duration:  1 week Timing:  Constant Progression:  Unchanged Chronicity:  Recurrent Context: at rest and spontaneously   Context: not recent antibiotic use   Relieved by:  None tried Worsened by:  Nothing Ineffective treatments:  None tried Associated symptoms: dysuria, urinary hesitancy and vaginal itching   Associated symptoms: no abdominal pain, no fever, no genital lesions, no nausea, no rash, no urinary frequency, no urinary incontinence and no vomiting     Past Medical History:  Diagnosis Date  . Anxiety   . Hypertension   . Obesity     Patient Active Problem List   Diagnosis Date Noted  . Abnormal uterine bleeding 01/18/2015  . DIABETES MELLITUS, GESTATIONAL, HX OF 06/05/2009  . GASTROENTERITIS 05/29/2008  . RLQ PAIN 05/06/2008  . LUMBAGO 04/19/2007    Past Surgical History:  Procedure Laterality Date  . TUBAL LIGATION      OB History    Gravida Para Term Preterm AB Living   2 2 2     2    SAB TAB Ectopic Multiple Live Births                   Home Medications    Prior to Admission medications   Medication Sig Start Date End Date Taking? Authorizing Provider    escitalopram (LEXAPRO) 10 MG tablet Take 10 mg by mouth daily.   Yes Historical Provider, MD  lansoprazole (PREVACID) 30 MG capsule Take 30 mg by mouth daily at 12 noon.   Yes Historical Provider, MD  rosuvastatin (CRESTOR) 10 MG tablet Take 10 mg by mouth every evening.   Yes Historical Provider, MD  valsartan-hydrochlorothiazide (DIOVAN-HCT) 160-12.5 MG per tablet  10/26/14  Yes Historical Provider, MD  ALPRAZolam (XANAX) 0.25 MG tablet Take 0.25 mg by mouth 2 (two) times daily as needed for anxiety.     Historical Provider, MD  Cetirizine HCl 10 MG CAPS Take 1 capsule (10 mg total) by mouth daily. 05/22/15   Junius FinnerErin O'Malley, PA-C  clobetasol cream (TEMOVATE) 0.05 % Apply 1 application topically 2 (two) times daily. 01/18/15   Reva Boresanya S Pratt, MD  cyclobenzaprine (FLEXERIL) 10 MG tablet Take 1 tablet (10 mg total) by mouth 3 (three) times daily as needed for muscle spasms. 11/17/12   Dione Boozeavid Glick, MD  fluconazole (DIFLUCAN) 150 MG tablet Take 1 tablet (150 mg total) by mouth once. May repeat in 72 hours. 11/03/15 11/03/15  Lattie HawStephen A Beese, MD  fluticasone (FLONASE) 50 MCG/ACT nasal spray Place 2 sprays into both nostrils daily. 05/22/15   Junius FinnerErin O'Malley, PA-C  ibuprofen (ADVIL,MOTRIN) 800 MG tablet  11/05/14  Historical Provider, MD  levonorgestrel-ethinyl estradiol (NORDETTE) 0.15-30 MG-MCG tablet Take 1 tablet by mouth daily.    Historical Provider, MD  metroNIDAZOLE (FLAGYL) 500 MG tablet Take one tab by mouth every 12 hours for 7 days. 11/03/15   Lattie HawStephen A Beese, MD  norethindrone (AYGESTIN) 5 MG tablet TAKE 1 TABLET BY MOUTH EVERY DAY IN THE MORNING 12/08/14   Historical Provider, MD    Family History Family History  Problem Relation Age of Onset  . Diabetes Mother   . Hypertension Mother   . Cancer Mother     breast    Social History Social History  Substance Use Topics  . Smoking status: Current Every Day Smoker    Packs/day: 1.00  . Smokeless tobacco: Never Used  . Alcohol use Yes      Allergies   Review of patient's allergies indicates no known allergies.   Review of Systems Review of Systems  Constitutional: Negative for fever.  Gastrointestinal: Negative for abdominal pain, nausea and vomiting.  Genitourinary: Positive for dysuria, hesitancy and vaginal discharge. Negative for bladder incontinence.  All other systems reviewed and are negative.    Physical Exam Triage Vital Signs ED Triage Vitals  Enc Vitals Group     BP 11/03/15 0900 141/90     Pulse Rate 11/03/15 0900 86     Resp 11/03/15 0900 16     Temp 11/03/15 0900 98.7 F (37.1 C)     Temp Source 11/03/15 0900 Oral     SpO2 11/03/15 0900 99 %     Weight 11/03/15 0900 215 lb (97.5 kg)     Height 11/03/15 0900 5' 1.5" (1.562 m)     Head Circumference --      Peak Flow --      Pain Score 11/03/15 0903 0     Pain Loc --      Pain Edu? --      Excl. in GC? --    No data found.   Updated Vital Signs BP 141/90 (BP Location: Left Arm)   Pulse 86   Temp 98.7 F (37.1 C) (Oral)   Resp 16   Ht 5' 1.5" (1.562 m)   Wt 215 lb (97.5 kg)   SpO2 99%   BMI 39.97 kg/m   Visual Acuity Right Eye Distance:   Left Eye Distance:   Bilateral Distance:    Right Eye Near:   Left Eye Near:    Bilateral Near:     Physical Exam Nursing notes and Vital Signs reviewed. Appearance:  Patient appears stated age, and in no acute distress Eyes:  Pupils are equal, round, and reactive to light and accomodation.  Extraocular movement is intact.  Conjunctivae are not inflamed  Nose:  Normal  Pharynx:  Normal Neck:  Supple.  No adenopathy Lungs:  Clear to auscultation.  Breath sounds are equal.  Moving air well. Heart:  Regular rate and rhythm without murmurs, rubs, or gallops.  Abdomen:  Nontender without masses or hepatosplenomegaly.  Bowel sounds are present.  No CVA or flank tenderness.  Extremities:  No edema.  Skin:  No rash present.   Pelvic exam deferred  UC Treatments / Results  Labs (all  labs ordered are listed, but only abnormal results are displayed) Labs Reviewed  POCT URINALYSIS DIP (MANUAL ENTRY) - Abnormal; Notable for the following:       Result Value   Ketones, POC UA trace (5) (*)    Leukocytes, UA Trace (*)  All other components within normal limits  URINE CULTURE  GC/CHLAMYDIA PROBE AMP  POCT WET + KOH PREP:  Epithelial cells present; positive clue cells; few budding yeast present; no trich, few WBC present; few bacteria    EKG  EKG Interpretation None       Radiology No results found.  Procedures Procedures (including critical care time)  Medications Ordered in UC Medications - No data to display   Initial Impression / Assessment and Plan / UC Course  I have reviewed the triage vital signs and the nursing notes.  Pertinent labs & imaging results that were available during my care of the patient were reviewed by me and considered in my medical decision making (see chart for details).  Clinical Course  Begin Flagyl.  Begin empiric Diflucan 150mg  (two doses) GC/chlamydia (urine specimen) pending.  Urine culture pending (note mild pyuria) Try taking probiotic FemDophilus (by [employer]) for prevention of BV. Followup with Family Doctor if not improved in one week.      Final Clinical Impressions(s) / UC Diagnoses   Final diagnoses:  Vaginal discharge  Bacterial vaginosis  Candida vaginitis    New Prescriptions New Prescriptions   FLUCONAZOLE (DIFLUCAN) 150 MG TABLET    Take 1 tablet (150 mg total) by mouth once. May repeat in 72 hours.   METRONIDAZOLE (FLAGYL) 500 MG TABLET    Take one tab by mouth every 12 hours for 7 days.     Lattie Haw, MD 11/03/15 [phone]

## 2015-11-04 LAB — GC/CHLAMYDIA PROBE AMP
CT Probe RNA: NOT DETECTED
GC Probe RNA: NOT DETECTED

## 2015-11-05 LAB — URINE CULTURE: Organism ID, Bacteria: 10000

## 2015-11-05 NOTE — Telephone Encounter (Signed)
ENCOUNTER CREATED TO ADD WET PREP ORDER AND DOCUMENT RESULT AS IT WAS NOT DOCUMENTED ON DOS, ORDER CANCELED.

## 2015-11-07 ENCOUNTER — Telehealth: Payer: Self-pay | Admitting: Emergency Medicine

## 2015-11-07 NOTE — Telephone Encounter (Signed)
Spoke with patient and after receiving her password, gave her all lab results.

## 2016-02-16 ENCOUNTER — Emergency Department
Admission: EM | Admit: 2016-02-16 | Discharge: 2016-02-16 | Disposition: A | Discharge status: Home / Self Care | Payer: BLUE CROSS/BLUE SHIELD | Source: Home / Self Care | Attending: Family Medicine | Admitting: Family Medicine

## 2016-02-16 DIAGNOSIS — N760 Acute vaginitis: Secondary | ICD-10-CM | POA: Diagnosis not present

## 2016-02-16 MED ORDER — FLUCONAZOLE 200 MG PO TABS: ORAL_TABLET | ORAL | 0 refills | Status: DC

## 2016-02-16 NOTE — ED Provider Notes (Signed)
CSN: [id number]     Arrival date & time 02/16/16  1919 History   First MD Initiated Contact with Patient 02/16/16 1936     Chief Complaint  Patient presents with  . Vaginal Itching   (Consider location/radiation/quality/duration/timing/severity/associated sxs/prior Treatment) HPI  Alexandra Ludwigori D Oquendo is a 40 y.o. female presenting to UC with c/o vaginal itching and irritation for about 2 days.  She reports hx of recurrent BV and yeast infections at the end of her menstrual cycles.  She tries to "catch them early" but has not tried any OTC medications she wants to be sure she is treating the right thing.  Denies fever, chills, n/v/d. Denies urinary symptoms. Minimal white-clear vaginal discharge. No vaginal bleeding.  Denies concern for STIs but notes if she is going to get a pelvic exam, tests for gonorrhea and chlamydia may be sent as well.     Past Medical History:  Diagnosis Date  . Anxiety   . Hypertension   . Obesity    Past Surgical History:  Procedure Laterality Date  . TUBAL LIGATION     Family History  Problem Relation Age of Onset  . Diabetes Mother   . Hypertension Mother   . Cancer Mother     breast   Social History  Substance Use Topics  . Smoking status: Current Every Day Smoker    Packs/day: 1.00  . Smokeless tobacco: Never Used  . Alcohol use Yes   OB History    Gravida Para Term Preterm AB Living   2 2 2     2    SAB TAB Ectopic Multiple Live Births                 Review of Systems  Constitutional: Negative for chills and fever.  Gastrointestinal: Negative for abdominal pain, diarrhea, nausea and vomiting.  Genitourinary: Positive for vaginal discharge and vaginal pain (itching, irritation). Negative for dysuria, flank pain, frequency, genital sores, hematuria, pelvic pain, urgency and vaginal bleeding.  Musculoskeletal: Negative for back pain and myalgias.    Allergies  Patient has no known allergies.  Home Medications   Prior to Admission  medications   Medication Sig Start Date End Date Taking? Authorizing Provider  ALPRAZolam (XANAX) 0.25 MG tablet Take 0.25 mg by mouth 2 (two) times daily as needed for anxiety.     Historical Provider, MD  Cetirizine HCl 10 MG CAPS Take 1 capsule (10 mg total) by mouth daily. 05/22/15   Junius FinnerErin O'Malley, PA-C  clobetasol cream (TEMOVATE) 0.05 % Apply 1 application topically 2 (two) times daily. 01/18/15   Reva Boresanya S Pratt, MD  cyclobenzaprine (FLEXERIL) 10 MG tablet Take 1 tablet (10 mg total) by mouth 3 (three) times daily as needed for muscle spasms. 11/17/12   Dione Boozeavid Glick, MD  escitalopram (LEXAPRO) 10 MG tablet Take 10 mg by mouth daily.    Historical Provider, MD  fluconazole (DIFLUCAN) 200 MG tablet Take 1 tab once. May repeat in 3 days if still having symptoms. 02/16/16   Junius FinnerErin O'Malley, PA-C  fluticasone (FLONASE) 50 MCG/ACT nasal spray Place 2 sprays into both nostrils daily. 05/22/15   Junius FinnerErin O'Malley, PA-C  ibuprofen (ADVIL,MOTRIN) 800 MG tablet  11/05/14   Historical Provider, MD  lansoprazole (PREVACID) 30 MG capsule Take 30 mg by mouth daily at 12 noon.    Historical Provider, MD  levonorgestrel-ethinyl estradiol (NORDETTE) 0.15-30 MG-MCG tablet Take 1 tablet by mouth daily.    Historical Provider, MD  metroNIDAZOLE (FLAGYL) 500 MG tablet Take  one tab by mouth every 12 hours for 7 days. 11/03/15   Lattie HawStephen A Beese, MD  norethindrone (AYGESTIN) 5 MG tablet TAKE 1 TABLET BY MOUTH EVERY DAY IN THE MORNING 12/08/14   Historical Provider, MD  rosuvastatin (CRESTOR) 10 MG tablet Take 10 mg by mouth every evening.    Historical Provider, MD  valsartan-hydrochlorothiazide (DIOVAN-HCT) 160-12.5 MG per tablet  10/26/14   Historical Provider, MD   Meds Ordered and Administered this Visit  Medications - No data to display  BP (!) 147/103 (BP Location: Left Arm)   Pulse 102   Temp 98 F (36.7 C) (Oral)   Ht 5\' 3"  (1.6 m)   Wt 207 lb (93.9 kg)   LMP 02/05/2016   SpO2 100%   BMI 36.67 kg/m  No data  found.   Physical Exam  Constitutional: She is oriented to person, place, and time. She appears well-developed and well-nourished. No distress.  HENT:  Head: Normocephalic and atraumatic.  Mouth/Throat: Oropharynx is clear and moist.  Eyes: EOM are normal.  Neck: Normal range of motion.  Cardiovascular: Normal rate.   Pulmonary/Chest: Effort normal.  Abdominal: Soft. She exhibits no distension. There is no tenderness.  Genitourinary:  Genitourinary Comments: Chaperoned exam. Mild external erythema and scant clear-white vaginal discharge in vaginal canal. No CMT, adnexal tenderness or masses.   Musculoskeletal: Normal range of motion.  Neurological: She is alert and oriented to person, place, and time.  Skin: Skin is warm and dry. She is not diaphoretic.  Psychiatric: She has a normal mood and affect. Her behavior is normal.  Nursing note and vitals reviewed.   Urgent Care Course   Clinical Course     Procedures (including critical care time)  Labs Review Labs Reviewed  WET PREP BY MOLECULAR PROBE   Narrative:    Performed at:  [facility]                [address], [address]                [place], [place] [address]  GC/CHLAMYDIA PROBE AMP    Imaging Review No results found.   MDM   1. Acute vaginitis    Pt c/o vaginal irritation. Hx of recurrent BV and yeast infections.  Mild erythema and discharge noted on exam.  Swabs for wet prep and GC/chlamydia sent to lab.  Prescription for diflucan prescribed. She may hold until tests come back or take this evening. If needed, additional medications such as flagyl can be called into pharmacy depending on test results.     Junius FinnerErin O'Malley, PA-C 02/17/16 [phone]

## 2016-02-16 NOTE — ED Triage Notes (Signed)
Pt started with minor itching Monday.

## 2016-02-17 ENCOUNTER — Telehealth: Payer: Self-pay | Admitting: Emergency Medicine

## 2016-02-17 LAB — GC/CHLAMYDIA PROBE AMP
CT Probe RNA: NOT DETECTED
GC Probe RNA: NOT DETECTED

## 2016-02-17 LAB — WET PREP BY MOLECULAR PROBE
Candida species: NEGATIVE
Gardnerella vaginalis: NEGATIVE
Trichomonas vaginosis: NEGATIVE

## 2016-02-17 NOTE — Telephone Encounter (Signed)
Labs neg, no need to fill rx, she is doing fine.

## 2016-03-02 ENCOUNTER — Encounter: Payer: Self-pay | Admitting: Allergy

## 2016-03-02 ENCOUNTER — Ambulatory Visit (INDEPENDENT_AMBULATORY_CARE_PROVIDER_SITE_OTHER): Payer: BLUE CROSS/BLUE SHIELD | Admitting: Allergy

## 2016-03-02 VITALS — BP 120/74 | HR 93 | Temp 98.2°F | Wt 211.2 lb

## 2016-03-02 DIAGNOSIS — L2489 Irritant contact dermatitis due to other agents: Secondary | ICD-10-CM

## 2016-03-02 DIAGNOSIS — J3089 Other allergic rhinitis: Secondary | ICD-10-CM

## 2016-03-02 NOTE — Progress Notes (Signed)
New Patient Note  RE: Alexandra [name] MRN: [id number] DOB: 1975/04/30 Date of Office Visit: 03/02/2016  Referring provider: Truman HaywardStarkes, Takia S, FNP Primary care provider: Truman Haywardakia S Starkes, FNP  Chief Complaint: rash and allergies  History of present illness: Alexandra [name] is a 40 y.o. female presenting today for consultation for rash and allergies.    She has been using different allergy medications (claritin, zyrtec, xyzal, flonase, nasonex,  saline rinse) to help with her allergy symptoms. Currently taking xyzal and was started on Singulair  about 4-6 weeks ago.  She has noted since being off her allergy medications for this visit today that her allergy symptoms have worsened. She does feel that Xyzal and Singulair have been helping somewhat. She reports symptoms of nasal congestion and PND, sneezing, ear fullness sensation.  Symptoms are year round but worse with season changes.  She reports she usually will get a sinus infection at the end of year.  She has also been provided with steroid injections to help with her nasal congestion.    She also has been experiencing a lot of itching and rash.   She states the rash started in her groin area.  She saw her GYN who did variety of test which all were negative.  She was given triamcinolone and nystatin to help.  She reports triamcinolone has helped but rash returns when she stops triamcinolone.   She also reports itchy rash at the base of her neck and behind her ears.  Her arms and breast areas have also been involved. She has not changed any hair products, soaps, detergents.  The itchy rash as been ongoing for past 2 months.  She states the rash does not look like hives.    No history of asthma or eczema or food allergy.    Review of systems: Review of Systems  Constitutional: Negative for chills, fever and malaise/fatigue.  HENT: Positive for congestion. Negative for ear discharge, nosebleeds, sinus pain and sore throat.   Eyes: Negative  for discharge and redness.  Respiratory: Negative for cough, shortness of breath and wheezing.   Cardiovascular: Negative for chest pain.  Gastrointestinal: Negative for heartburn, nausea and vomiting.  Genitourinary: Negative for dysuria.  Skin: Positive for itching and rash.   All other systems negative unless noted above in HPI  Past medical history: Past Medical History:  Diagnosis Date  . Anxiety   . Hypertension   . Obesity     Past surgical history: Past Surgical History:  Procedure Laterality Date  . TUBAL LIGATION      Family history:  Family History  Problem Relation Age of Onset  . Diabetes Mother   . Hypertension Mother   . Cancer Mother     breast  . Asthma Neg Hx   . Allergic rhinitis Neg Hx     Social history: Social History   Social History  . Marital status: Divorced    Spouse name: N/A  . Number of children: N/A  . Years of education: N/A   Occupational History  . 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 Smoking status: Current Every Day Smoker    Packs/day: 1.00  . Smokeless tobacco: Never Used  . Alcohol use Yes     Comment: social   . Drug use: No  . Sexual activity: Yes    Partners: Male   Social History Narrative  . She lives in an apartment with carpeting with electric heating  and central cooling. There are no pets in the home. There is no concern for water damage or mildew or roaches in the home.     Medication List: Allergies as of 03/02/2016   No Known Allergies     Medication List       Accurate as of 03/02/16 11:59 PM. Always use your most recent med list.          ALPRAZolam 0.25 MG tablet Commonly known as:  XANAX Take 0.25 mg by mouth 2 (two) times daily as needed for anxiety.   escitalopram 10 MG tablet Commonly known as:  LEXAPRO Take 10 mg by mouth daily.   ibuprofen 800 MG tablet Commonly known as:  ADVIL,MOTRIN   lansoprazole 30 MG capsule Commonly known as:   PREVACID Take 30 mg by mouth daily at 12 noon.   levocetirizine 5 MG tablet Commonly known as:  XYZAL Take 5 mg by mouth every evening.   levonorgestrel-ethinyl estradiol 0.15-30 MG-MCG tablet Commonly known as:  NORDETTE Take 1 tablet by mouth daily.   mometasone 50 MCG/ACT nasal spray Commonly known as:  NASONEX Place 2 sprays into the nose daily.   norethindrone 5 MG tablet Commonly known as:  AYGESTIN TAKE 1 TABLET BY MOUTH EVERY DAY IN THE MORNING   rosuvastatin 10 MG tablet Commonly known as:  CRESTOR Take 10 mg by mouth every evening.   valsartan-hydrochlorothiazide 160-12.5 MG tablet Commonly known as:  DIOVAN-HCT       Known medication allergies: No Known Allergies   Physical examination: Blood pressure 120/74, pulse 93, temperature 98.2 F (36.8 C), temperature source Oral, weight 211 lb 3.2 oz (95.8 kg), last menstrual period 02/05/2016, SpO2 96 %.  General: Alert, interactive, in no acute distress. HEENT: TMs pearly gray, turbinates markedly edematous and pale with thick discharge, post-pharynx non erythematous. Neck: Supple without lymphadenopathy. Lungs: Clear to auscultation without wheezing, rhonchi or rales. {no increased work of breathing. CV: Normal S1, S2 without murmurs. Abdomen: Nondistended, nontender. Skin: Warm and dry, without lesions or rashes. Extremities:  No clubbing, cyanosis or edema. Neuro:   Grossly intact.  Diagnositics/Labs: Allergy testing: Positive for cockroach on Intradermal testing Allergy testing results were read and interpreted by provider, documented by clinical staff.   Assessment and plan:   Allergic rhinitis  - Allergy test today was positive for cockroach.    - Will obtain environmental allergy profile as her symptoms and timing of her symptoms would expect she would be sensitive to other allergens in addition to cockroach.   - Resume Singulair 10 mg take at bedtime  - Resume Xyzal 5 mg daily  - Recommended  daily saline rinse prior to using any nasal spray.     - For the next 3 days use Afrin nasal spray 2 sprays each nostril after saline rinse.  Wait 15min or until you are breathing more freely and then use Nasonex 2 sprays each nostril.    After 3 days of afrin use continue saline rinse and Nasonex daily.    Rash  - Pruritic rash may be related to a contact dermatitis. Advise patch testing to evaluate for this. Shee will need to return on a Monday, Tuesday or Wednesday for patch test placement.   - Antihistamine regimen as above to help with itch control  - She may continue triamcinolone to the areas that she feels it has been helpful.  Follow-up in early January for patch test placement   I appreciate the opportunity to take part  in Alexandra [name]'s care. Please do not hesitate to contact me with questions.  Sincerely,   Margo Aye, MD Allergy/Immunology [facility]

## 2016-03-02 NOTE — Patient Instructions (Addendum)
Allergy test today was positive for cockroach.    Will obtain environmental allergy profile.   Resume Singulair 10 mg take at bedtime  Resume Xyzal 5 mg daily  Recommended daily saline rinse prior to using any nasal spray.     For the next 3 days use Afrin nasal spray 2 sprays each nostril after saline rinse.  Wait 15min or until you are breathing more freely and then use Nasonex 2 sprays each nostril.     After 3 days of afrin use continue saline rinse and Nasonex daily.    Itchy rash may be related to a contact dermatitis. Advise patch testing to evaluate for this. He will need to return on a Monday, Tuesday or Wednesday for patch test placement.   Follow-up in early January for patch test placement

## 2016-03-03 NOTE — Addendum Note (Signed)
Addended by: Lucretia RoersWOOD, AMBER L on: 03/03/2016 10:45 AM   Modules accepted: Orders

## 2016-03-10 LAB — ALLERGENS, ZONE 3
Alternaria Alternata IgE: <0.1 kU/L
Bahia Grass IgE: <0.1 kU/L
Cat Dander IgE: <0.1 kU/L
Cedar, Mountain IgE: <0.1 kU/L
Cladosporium Herbarum IgE: <0.1 kU/L
Common Silver Birch IgE: <0.1 kU/L
Elm, American IgE: <0.1 kU/L
Johnson Grass IgE: <0.1 kU/L
Nettle IgE: <0.1 kU/L
Pigweed, Rough IgE: <0.1 kU/L
Plantain, English IgE: <0.1 kU/L
Stemphylium Herbarum IgE: <0.1 kU/L
White Mulberry IgE: <0.1 kU/L

## 2016-06-15 ENCOUNTER — Other Ambulatory Visit: Payer: Self-pay | Admitting: Obstetrics & Gynecology

## 2016-08-26 ENCOUNTER — Encounter: Payer: Self-pay | Admitting: Emergency Medicine

## 2016-08-26 ENCOUNTER — Emergency Department
Admission: EM | Admit: 2016-08-26 | Discharge: 2016-08-26 | Disposition: A | Discharge status: Home / Self Care | Payer: BLUE CROSS/BLUE SHIELD | Source: Home / Self Care | Attending: Family Medicine | Admitting: Family Medicine

## 2016-08-26 DIAGNOSIS — R030 Elevated blood-pressure reading, without diagnosis of hypertension: Secondary | ICD-10-CM

## 2016-08-26 DIAGNOSIS — N760 Acute vaginitis: Secondary | ICD-10-CM

## 2016-08-26 NOTE — ED Triage Notes (Signed)
Pt states that she is having some vaginal irritation x 2 weeks after using a bath bomb...been itching, tried Hydrocortisone cream and Nystatin cream, minimal relief.

## 2016-08-26 NOTE — ED Provider Notes (Signed)
CSN: [id number]     Arrival date & time 08/26/16  1155 History   First MD Initiated Contact with Patient 08/26/16 1223     Chief Complaint  Patient presents with  . Vaginal Itching   (Consider location/radiation/quality/duration/timing/severity/associated sxs/prior Treatment) HPI  Alexandra [name] is a 41 y.o. female presenting to UC with c/o 2 weeks gradually worsening vaginal discharge after using a bath bomb.  She has used them one other time w/o side effects. Minimal discharge.  Denies bleeding. Denies urinary symptoms. Denies n/v/d. She would like to be checked for gonorrhea and chlamydia where here as well.   Past Medical History:  Diagnosis Date  . Anxiety   . Hypertension   . Obesity    Past Surgical History:  Procedure Laterality Date  . TUBAL LIGATION     Family History  Problem Relation Age of Onset  . Diabetes Mother   . Hypertension Mother   . Cancer Mother        breast  . Asthma Neg Hx   . Allergic rhinitis Neg Hx    Social History  Substance Use Topics  . Smoking status: Current Every Day Smoker    Packs/day: 1.00  . Smokeless tobacco: Never Used  . Alcohol use Yes     Comment: social    OB History    Gravida Para Term Preterm AB Living   2 2 2     2    SAB TAB Ectopic Multiple Live Births                 Review of Systems  Constitutional: Negative for chills and fever.  Gastrointestinal: Negative for abdominal pain, diarrhea, nausea and vomiting.  Genitourinary: Positive for vaginal discharge and vaginal pain (itching and irritation). Negative for dysuria, frequency, pelvic pain, urgency and vaginal bleeding.    Allergies  Patient has no known allergies.  Home Medications   Prior to Admission medications   Medication Sig Start Date End Date Taking? Authorizing Provider  ALPRAZolam (XANAX) 0.25 MG tablet Take 0.25 mg by mouth 2 (two) times daily as needed for anxiety.     [provider]  escitalopram (LEXAPRO) 10 MG tablet Take 10 mg  by mouth daily.    [provider]  lansoprazole (PREVACID) 30 MG capsule Take 30 mg by mouth daily at 12 noon.    [provider]  levocetirizine (XYZAL) 5 MG tablet Take 5 mg by mouth every evening.    [provider]  levonorgestrel-ethinyl estradiol (NORDETTE) 0.15-30 MG-MCG tablet Take 1 tablet by mouth daily.    [provider]  mometasone (NASONEX) 50 MCG/ACT nasal spray Place 2 sprays into the nose daily.    [provider]  rosuvastatin (CRESTOR) 10 MG tablet Take 10 mg by mouth every evening.    [provider]  valsartan-hydrochlorothiazide (DIOVAN-HCT) 160-12.5 MG per tablet  10/26/14   [provider]   Meds Ordered and Administered this Visit  Medications - No data to display  BP (!) 168/121 (BP Location: Left Arm)   Pulse (!) 111   Temp 98.6 F (37 C) (Oral)   Ht 5' 1.5" (1.562 m)   Wt 212 lb 4 oz (96.3 kg)   LMP 08/18/2016 (Exact Date)   SpO2 98%   BMI 39.45 kg/m  No data found.   Physical Exam  Constitutional: She is oriented to person, place, and time. She appears well-developed and well-nourished. No distress.  HENT:  Head: Normocephalic and atraumatic.  Eyes: EOM are normal.  Neck: Normal range of motion.  Cardiovascular: Normal rate and regular rhythm.   Pulmonary/Chest: Effort normal and breath sounds normal. No respiratory distress.  Abdominal: Soft. She exhibits no distension. There is no tenderness.  Genitourinary:  Genitourinary Comments: Chaperoned exam. Normal external genitalia. Vaginal canal- small to moderate amount of creamy white discharge. No bleeding. No CMT, adnexal tenderness or masses.   Musculoskeletal: Normal range of motion.  Neurological: She is alert and oriented to person, place, and time.  Skin: Skin is warm and dry. She is not diaphoretic.  Psychiatric: She has a normal mood and affect. Her behavior is normal.  Nursing note and vitals reviewed.   Urgent Care Course      Procedures (including critical care time)  Labs Review Labs Reviewed  WET PREP BY MOLECULAR PROBE  GC/CHLAMYDIA PROBE AMP    Imaging Review No results found.    MDM   1. Acute vaginitis   2. Elevated blood pressure reading    Pt c/o vaginal irritation after using a bath bomb. Labs pending: Wet prep and GC/chlamydia  Will call in medication if indicated based off lab results. Pt may try OTC preperation H or diaper rash cream in meantime to help with irritation.  F/u with PCP for ongoing healthcare needs including her elevated blood pressure.     Lurene Shadow, PA-C 08/26/16 1245

## 2016-08-27 LAB — WET PREP BY MOLECULAR PROBE
Candida species: NOT DETECTED
Gardnerella vaginalis: DETECTED — AB
Trichomonas vaginosis: NOT DETECTED

## 2016-08-28 ENCOUNTER — Telehealth: Payer: Self-pay | Admitting: *Deleted

## 2016-08-28 LAB — GC/CHLAMYDIA PROBE AMP
CT Probe RNA: DETECTED — AB
GC Probe RNA: NOT DETECTED

## 2016-08-28 MED ORDER — METRONIDAZOLE 500 MG PO TABS: 500.0000 mg | ORAL_TABLET | Freq: Two times a day (BID) | ORAL | 0 refills | Status: DC

## 2016-08-28 NOTE — Telephone Encounter (Signed)
Spoke to pt given wet prep results. Called in flagyl per dr Cathren Harshbeese. Will call pt back when G/C results are final. Pt verbalized understanding.

## 2016-08-29 ENCOUNTER — Telehealth: Payer: Self-pay | Admitting: *Deleted

## 2016-08-29 MED ORDER — AZITHROMYCIN 500 MG PO TABS
1000.0000 mg | ORAL_TABLET | ORAL | 0 refills | Status: AC
Start: 2016-08-29 — End: 2016-08-29

## 2016-08-29 NOTE — Telephone Encounter (Signed)
Spoke to pt given G/C results and sent in ABT per dr Cathren Harshbeese. Pt verbalized understanding.

## 2016-08-29 NOTE — Telephone Encounter (Signed)
Telephone note created in error. 

## 2016-09-06 NOTE — Addendum Note (Signed)
Addended by: Kathrine HaddockWOOD, Oline Belk L on: 09/06/2016 03:13 PM   Modules accepted: Orders

## 2016-09-19 ENCOUNTER — Ambulatory Visit
Admission: RE | Admit: 2016-09-19 | Discharge: 2016-09-19 | Disposition: A | Discharge status: Home / Self Care | Payer: BLUE CROSS/BLUE SHIELD | Source: Ambulatory Visit | Attending: Family | Admitting: Family

## 2016-09-19 ENCOUNTER — Other Ambulatory Visit: Payer: Self-pay | Admitting: Family

## 2016-09-19 DIAGNOSIS — M25469 Effusion, unspecified knee: Secondary | ICD-10-CM

## 2016-10-03 ENCOUNTER — Other Ambulatory Visit: Payer: Self-pay | Admitting: Obstetrics & Gynecology

## 2016-10-03 DIAGNOSIS — Z1231 Encounter for screening mammogram for malignant neoplasm of breast: Secondary | ICD-10-CM

## 2016-10-13 ENCOUNTER — Ambulatory Visit
Admission: RE | Admit: 2016-10-13 | Discharge: 2016-10-13 | Disposition: A | Discharge status: Home / Self Care | Payer: BLUE CROSS/BLUE SHIELD | Source: Ambulatory Visit | Attending: Obstetrics & Gynecology | Admitting: Obstetrics & Gynecology

## 2016-10-13 DIAGNOSIS — Z1231 Encounter for screening mammogram for malignant neoplasm of breast: Secondary | ICD-10-CM

## 2017-02-16 ENCOUNTER — Emergency Department
Admission: EM | Admit: 2017-02-16 | Discharge: 2017-02-16 | Disposition: A | Discharge status: Home / Self Care | Payer: BLUE CROSS/BLUE SHIELD | Source: Home / Self Care | Attending: Family Medicine | Admitting: Family Medicine

## 2017-02-16 ENCOUNTER — Encounter: Payer: Self-pay | Admitting: *Deleted

## 2017-02-16 ENCOUNTER — Other Ambulatory Visit: Payer: Self-pay

## 2017-02-16 DIAGNOSIS — N898 Other specified noninflammatory disorders of vagina: Secondary | ICD-10-CM | POA: Diagnosis not present

## 2017-02-16 DIAGNOSIS — B373 Candidiasis of vulva and vagina: Secondary | ICD-10-CM

## 2017-02-16 DIAGNOSIS — B3731 Acute candidiasis of vulva and vagina: Secondary | ICD-10-CM

## 2017-02-16 MED ORDER — FLUCONAZOLE 150 MG PO TABS
ORAL_TABLET | ORAL | 0 refills | Status: DC
Start: 2017-02-16 — End: 2018-11-09

## 2017-02-16 NOTE — Discharge Instructions (Signed)
Continue probiotic

## 2017-02-16 NOTE — ED Provider Notes (Signed)
[facility]    CSN: [id number] Arrival date & time: 02/16/17  1815     History   Chief Complaint Chief Complaint  Patient presents with  . Vaginal Itching  . Vaginal Discharge    HPI Alexandra [name] is a 41 y.o. female.   Patient complains of one week history of mild recurrent vaginal discharge without pelvic or abdominal pain.  Patient's last menstrual period was 02/02/2017.  She requests GC/chlamydia testing.   The history is provided by the patient.  Vaginal Discharge  Quality:  Watery, white and thin Severity:  Mild Onset quality:  Sudden Duration:  1 week Timing:  Constant Progression:  Unchanged Chronicity:  Recurrent Context: at rest   Relieved by:  None tried Worsened by:  Nothing Ineffective treatments:  None tried Associated symptoms: vaginal itching   Associated symptoms: no abdominal pain, no dysuria, no fever, no genital lesions, no nausea, no rash, no urinary frequency, no urinary hesitancy, no urinary incontinence and no vomiting     Past Medical History:  Diagnosis Date  . Anxiety   . Hypertension   . Obesity     Patient Active Problem List   Diagnosis Date Noted  . Abnormal uterine bleeding 01/18/2015  . DIABETES MELLITUS, GESTATIONAL, HX OF 06/05/2009  . GASTROENTERITIS 05/29/2008  . RLQ PAIN 05/06/2008  . LUMBAGO 04/19/2007    Past Surgical History:  Procedure Laterality Date  . TUBAL LIGATION      OB History    Gravida Para Term Preterm AB Living   2 2 2     2    SAB TAB Ectopic Multiple Live Births                   Home Medications    Prior to Admission medications   Medication Sig Start Date End Date Taking? Authorizing Provider  buPROPion (WELLBUTRIN SR) 150 MG 12 hr tablet Take 150 mg by mouth 2 (two) times daily.   Yes [provider]  esomeprazole (NEXIUM) 40 MG capsule Take 40 mg by mouth daily at 12 noon.   Yes [provider]  hydrochlorothiazide (MICROZIDE) 12.5 MG capsule Take 10  mg by mouth daily.   Yes [provider]  IRBESARTAN PO Take by mouth.   Yes [provider]  levonorgestrel-ethinyl estradiol (LILLOW) 0.15-30 MG-MCG tablet Take 1 tablet by mouth daily.   Yes [provider]  fluconazole (DIFLUCAN) 150 MG tablet Take one tab by mouth as a single dose.  May repeat in 72 hours. 02/16/17   Lattie HawBeese, Christelle Igoe A, MD    Family History Family History  Problem Relation Age of Onset  . Diabetes Mother   . Hypertension Mother   . Cancer Mother        breast, lung, bladder  . Breast cancer Mother   . Asthma Neg Hx   . Allergic rhinitis Neg Hx     Social History Social History   Tobacco Use  . Smoking status: Current Every Day Smoker    Packs/day: 0.25    Types: Cigarettes  . Smokeless tobacco: Never Used  Substance Use Topics  . Alcohol use: Yes    Comment: social   . Drug use: No     Allergies   Patient has no known allergies.   Review of Systems Review of Systems  Constitutional: Negative for fever.  Gastrointestinal: Negative for abdominal pain, nausea and vomiting.  Genitourinary: Positive for vaginal discharge. Negative for bladder incontinence, dysuria, flank pain,  genital sores, hematuria, hesitancy, pelvic pain, urgency and vaginal pain.  All other systems reviewed and are negative.    Physical Exam Triage Vital Signs ED Triage Vitals [02/16/17 1859]  Enc Vitals Group     BP 126/84     Pulse Rate (!) 108     Resp 14     Temp 98.9 F (37.2 C)     Temp Source Oral     SpO2 99 %     Weight 224 lb (101.6 kg)     Height      Head Circumference      Peak Flow      Pain Score 0     Pain Loc      Pain Edu?      Excl. in GC?    No data found.  Updated Vital Signs BP 126/84 (BP Location: Right Arm)   Pulse (!) 108   Temp 98.9 F (37.2 C) (Oral)   Resp 14   Wt 224 lb (101.6 kg)   LMP 02/02/2017   SpO2 99%   BMI 41.64 kg/m   Visual Acuity Right Eye Distance:   Left Eye Distance:   Bilateral  Distance:    Right Eye Near:   Left Eye Near:    Bilateral Near:     Physical Exam Nursing notes and Vital Signs reviewed. Appearance:  Patient appears stated age, and in no acute distress.    Eyes:  Pupils are equal, round, and reactive to light and accomodation.  Extraocular movement is intact.  Conjunctivae are not inflamed   Pharynx:  Normal; moist mucous membranes  Neck:  Supple.  No adenopathy Lungs:  Clear to auscultation.  Breath sounds are equal.  Moving air well. Heart:  Regular rate and rhythm without murmurs, rubs, or gallops.  Abdomen:  Nontender without masses or hepatosplenomegaly.  Bowel sounds are present.  No CVA or flank tenderness.  Extremities:  No edema.  Skin:  No rash present.    Pelvic exam deferred.  Patient performed self-obtained vaginal specimen.  UC Treatments / Results  Labs (all labs ordered are listed, but only abnormal results are displayed) Labs Reviewed  C. TRACHOMATIS/N. GONORRHOEAE RNA  POCT WET + KOH PREP:  Positive hyphae, otherwise negative    EKG  EKG Interpretation None       Radiology No results found.  Procedures Procedures (including critical care time)  Medications Ordered in UC Medications - No data to display   Initial Impression / Assessment and Plan / UC Course  I have reviewed the triage vital signs and the nursing notes.  Pertinent labs & imaging results that were available during my care of the patient were reviewed by me and considered in my medical decision making (see chart for details).    Begin Diflucan. GC/chlamydia pending. Continue probiotic. Followup with Family Doctor if not improved in one week.     Final Clinical Impressions(s) / UC Diagnoses   Final diagnoses:  Vaginal discharge  Candida vaginitis    ED Discharge Orders        Ordered    fluconazole (DIFLUCAN) 150 MG tablet     02/16/17 1953           Lattie HawBeese, Trigg Delarocha A, MD 02/17/17 [phone]

## 2017-02-16 NOTE — ED Triage Notes (Signed)
Patient c/o vaginal itching and minimal white discharge. She would like to be tested for G/C Chlamydia as well. No pelvic pain or fever.   PasswordThana Farr: Deshawn [phone]

## 2017-02-17 ENCOUNTER — Telehealth: Payer: Self-pay | Admitting: Emergency Medicine

## 2017-02-17 LAB — C. TRACHOMATIS/N. GONORRHOEAE RNA
C. TRACHOMATIS RNA, TMA: NOT DETECTED
N. GONORRHOEAE RNA, TMA: NOT DETECTED

## 2017-02-17 NOTE — Telephone Encounter (Signed)
GC/Chlamydia results to patient. AP, CMA

## 2017-02-20 LAB — POCT WET + KOH PREP: TRICH BY WET PREP: ABSENT

## 2017-09-27 ENCOUNTER — Other Ambulatory Visit: Payer: Self-pay | Admitting: Obstetrics & Gynecology

## 2017-09-27 DIAGNOSIS — Z1231 Encounter for screening mammogram for malignant neoplasm of breast: Secondary | ICD-10-CM

## 2017-10-18 ENCOUNTER — Inpatient Hospital Stay: Admission: RE | Admit: 2017-10-18 | Payer: BLUE CROSS/BLUE SHIELD | Source: Ambulatory Visit

## 2017-11-16 ENCOUNTER — Ambulatory Visit
Admission: RE | Admit: 2017-11-16 | Discharge: 2017-11-16 | Disposition: A | Discharge status: Home / Self Care | Payer: BLUE CROSS/BLUE SHIELD | Source: Ambulatory Visit | Attending: Obstetrics & Gynecology | Admitting: Obstetrics & Gynecology

## 2017-11-16 DIAGNOSIS — Z1231 Encounter for screening mammogram for malignant neoplasm of breast: Secondary | ICD-10-CM

## 2018-11-09 ENCOUNTER — Ambulatory Visit (INDEPENDENT_AMBULATORY_CARE_PROVIDER_SITE_OTHER)
Admission: RE | Admit: 2018-11-09 | Discharge: 2018-11-09 | Disposition: A | Discharge status: Home / Self Care | Payer: BLUE CROSS/BLUE SHIELD | Source: Ambulatory Visit

## 2018-11-09 DIAGNOSIS — N898 Other specified noninflammatory disorders of vagina: Secondary | ICD-10-CM

## 2018-11-09 MED ORDER — FLUCONAZOLE 150 MG PO TABS: 150.0000 mg | ORAL_TABLET | ORAL | 0 refills | Status: AC

## 2018-11-09 MED ORDER — CLINDAMYCIN HCL 300 MG PO CAPS: 300.0000 mg | ORAL_CAPSULE | Freq: Two times a day (BID) | ORAL | 0 refills | Status: AC

## 2018-11-09 NOTE — ED Provider Notes (Signed)
Virtual Visit via Video Note:  Alexandra [name]  initiated request for [facility]. I connected with Alexandra [name]  on 11/09/2018 at 1:20 PM  for a synchronized telemedicine visit using a video enabled HIPPA compliant telemedicine application. I verified that I am speaking with Alexandra [name]  using two identifiers. Wallis BambergMario Nicha Hemann, PA-C  was physically located in a [facility] site and Alexandra [name] was located at a different location.   The limitations of evaluation and management by telemedicine as well as the availability of in-person appointments were discussed. Patient was informed that she  may incur a bill ( including co-pay) for this virtual visit encounter. Alexandra [name]  expressed understanding and gave verbal consent to proceed with virtual visit.     History of Present Illness:Alexandra [name]  is a 43 y.o. female presents with 3-4 day history of vaginal itching, slight white discharge and not thick like yeast or malodorous like BV. Last episode was a few months ago.  She has typically done well by taking treatment for both yeast and BV.  Patient has tubal ligation.  She is in a monogamous relationship and has low suspicion for STI.  ROS Denies fever, nausea, vomiting, belly pain, pelvic pain, dysuria, urinary frequency, hematuria, genital rash.  No current facility-administered medications for this encounter.    Current Outpatient Medications  Medication Sig Dispense Refill  . buPROPion (WELLBUTRIN SR) 150 MG 12 hr tablet Take 150 mg by mouth 2 (two) times daily.    Marland Kitchen. esomeprazole (NEXIUM) 40 MG capsule Take 40 mg by mouth daily at 12 noon.    . fluconazole (DIFLUCAN) 150 MG tablet Take one tab by mouth as a single dose.  May repeat in 72 hours. 2 tablet 0  . hydrochlorothiazide (MICROZIDE) 12.5 MG capsule Take 10 mg by mouth daily.    . IRBESARTAN PO Take by mouth.    . levonorgestrel-ethinyl estradiol (LILLOW) 0.15-30 MG-MCG  tablet Take 1 tablet by mouth daily.       No Known Allergies    Past Medical History:  Diagnosis Date  . Anxiety   . Hypertension   . Obesity     Past Surgical History:  Procedure Laterality Date  . TUBAL LIGATION        Observations/Objective: Physical Exam Constitutional:      General: She is not in acute distress.    Appearance: Normal appearance. She is well-developed. She is not ill-appearing, toxic-appearing or diaphoretic.  Eyes:     Extraocular Movements: Extraocular movements intact.  Pulmonary:     Effort: Pulmonary effort is normal.  Neurological:     General: No focal deficit present.     Mental Status: She is alert and oriented to person, place, and time.  Psychiatric:        Mood and Affect: Mood normal.        Behavior: Behavior normal.        Thought Content: Thought content normal.        Judgment: Judgment normal.      Assessment and Plan:  1. Vaginal itching   2. Vaginal discharge    Will cover for yeast infection and BV with Diflucan and at patient's request clindamycin.  Counseled patient that it would be best to obtain laboratory data to support the use of these medications especially if she has no improvement over the next 2 to 3 days.  She was agreeable to  doing an in person visit if this is the case.  Counseled patient on potential for adverse effects with medications prescribed/recommended today, ER and return-to-clinic precautions discussed, patient verbalized understanding.    Follow Up Instructions:    I discussed the assessment and treatment plan with the patient. The patient was provided an opportunity to ask questions and all were answered. The patient agreed with the plan and demonstrated an understanding of the instructions.   The patient was advised to call back or seek an in-person evaluation if the symptoms worsen or if the condition fails to improve as anticipated.  I provided 15 minutes of non-face-to-face time during  this encounter.    Jaynee Eagles, PA-C  11/09/2018 1:20 PM      Jaynee Eagles, PA-C 11/09/18 1331

## 2020-02-11 IMAGING — MG DIGITAL SCREENING BILATERAL MAMMOGRAM WITH TOMO AND CAD
8 series · 8 of 24 positions shown · non-contrast
Comparison: Previous exam(s).

CLINICAL DATA: Screening.

EXAM:
DIGITAL SCREENING BILATERAL MAMMOGRAM WITH TOMO AND CAD

[L MLO synth-2D]
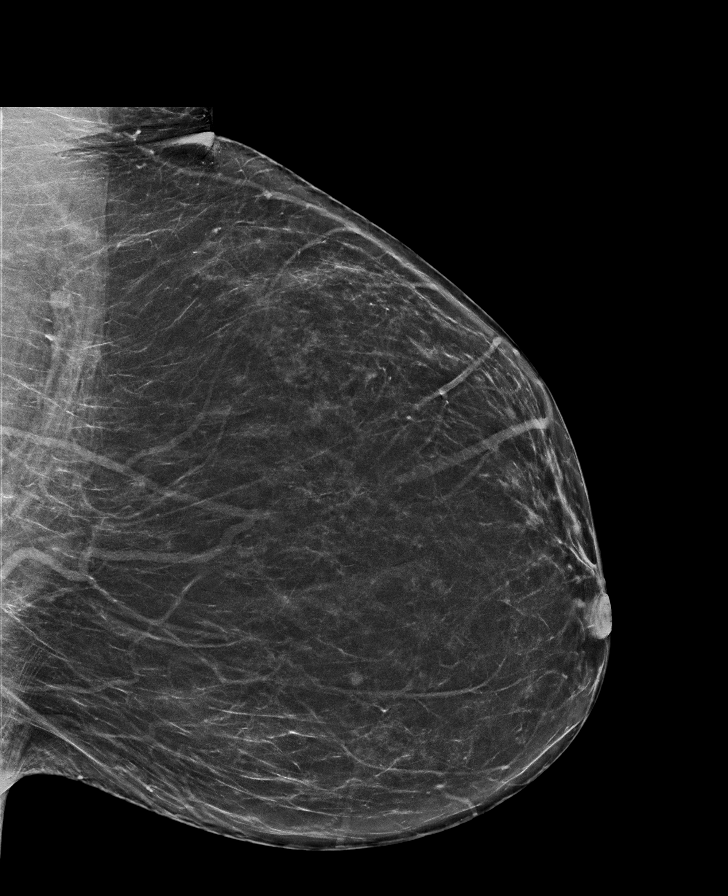

[R CC synth-2D]
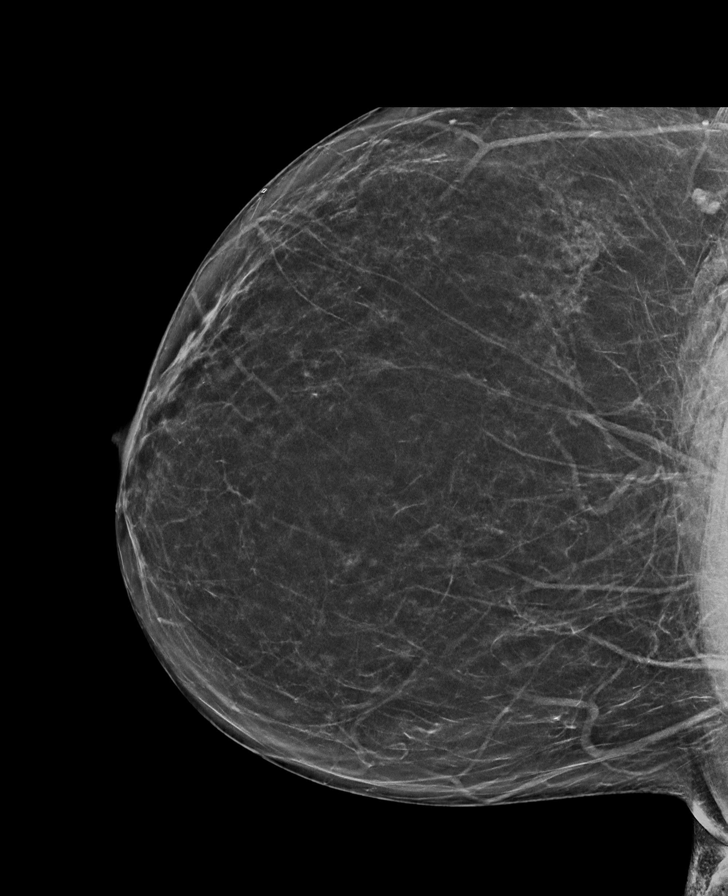

[L CC synth-2D]
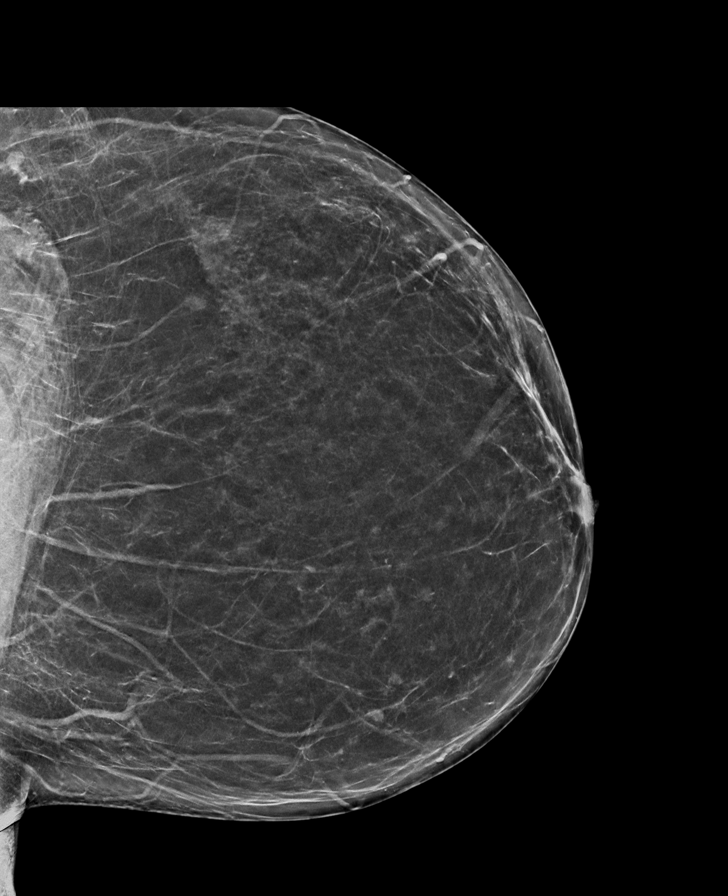

[R MLO synth-2D]
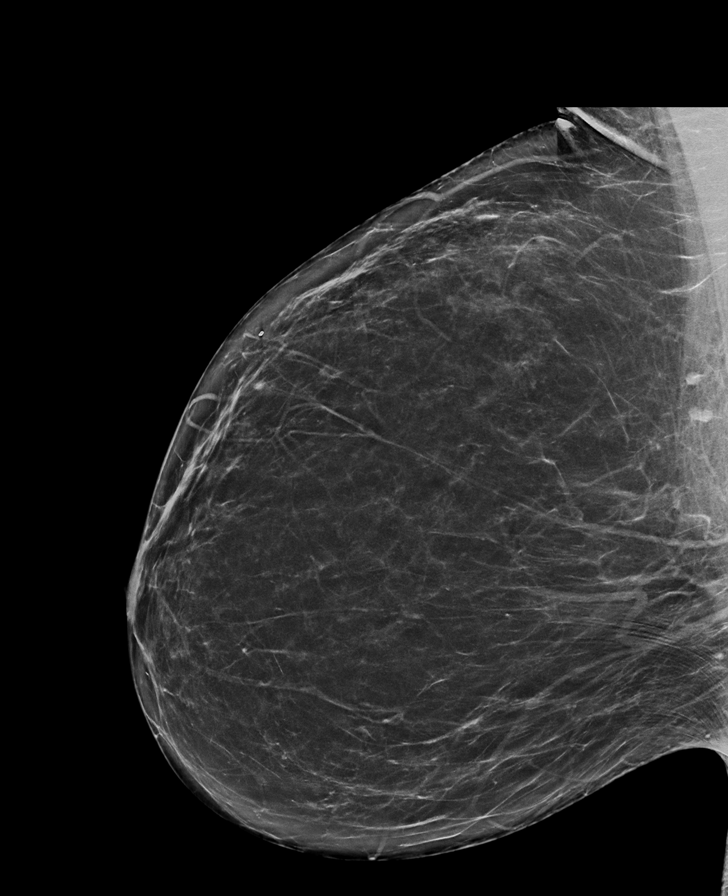

[R MLO tomo · tomo slice 39/78.0]
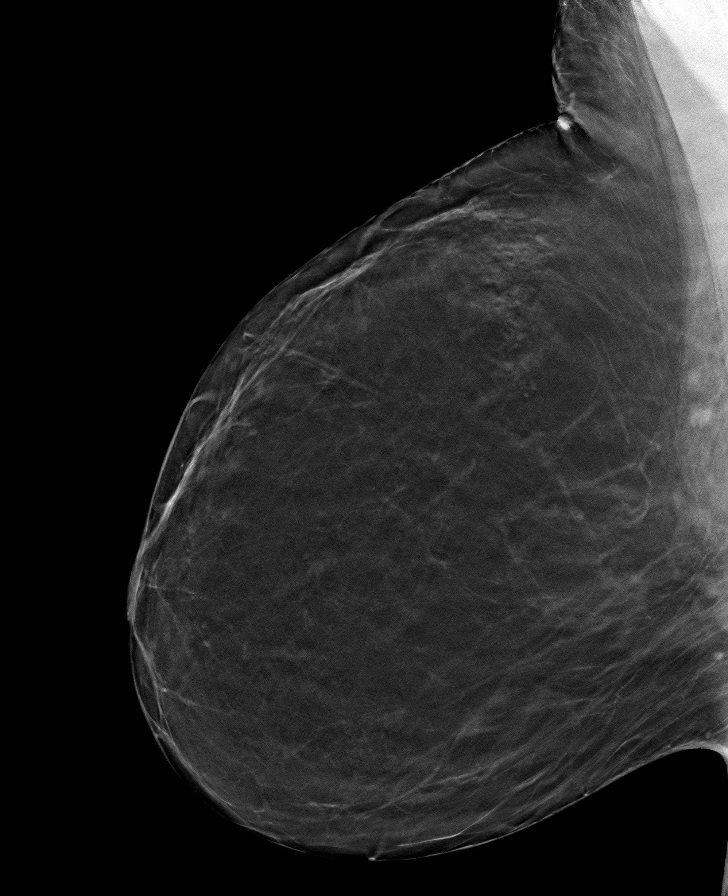

[L CC tomo · tomo slice 35/69.0]
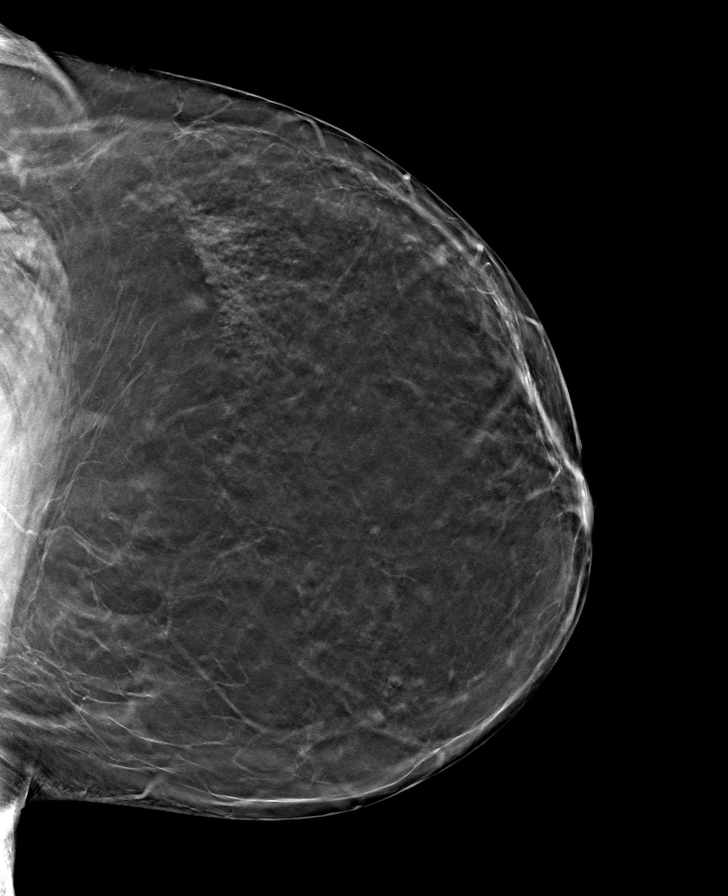

[L MLO tomo · tomo slice 37/73.0]
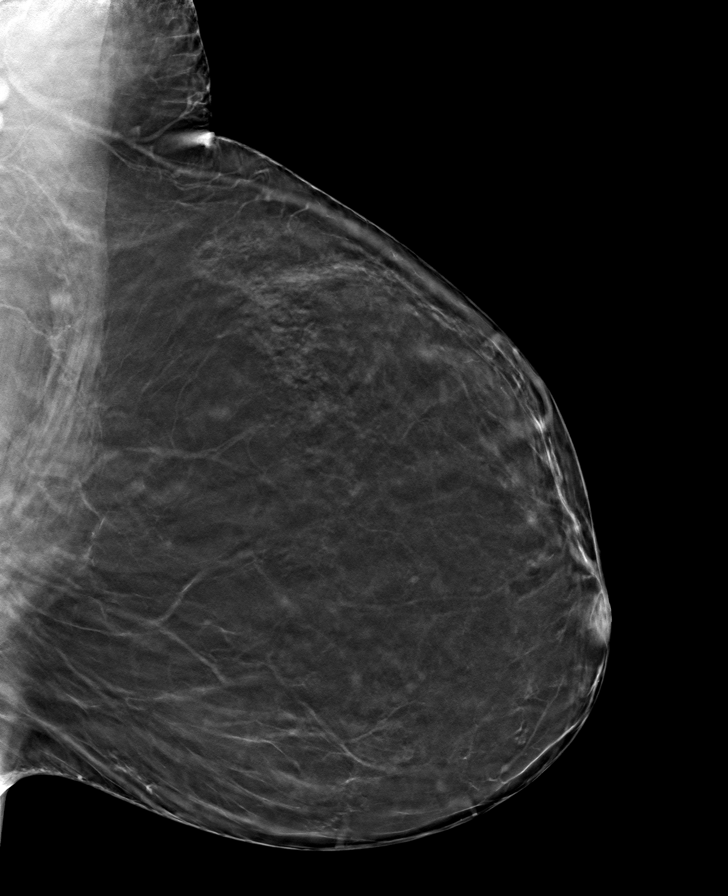

[R CC tomo · tomo slice 35/68.0]
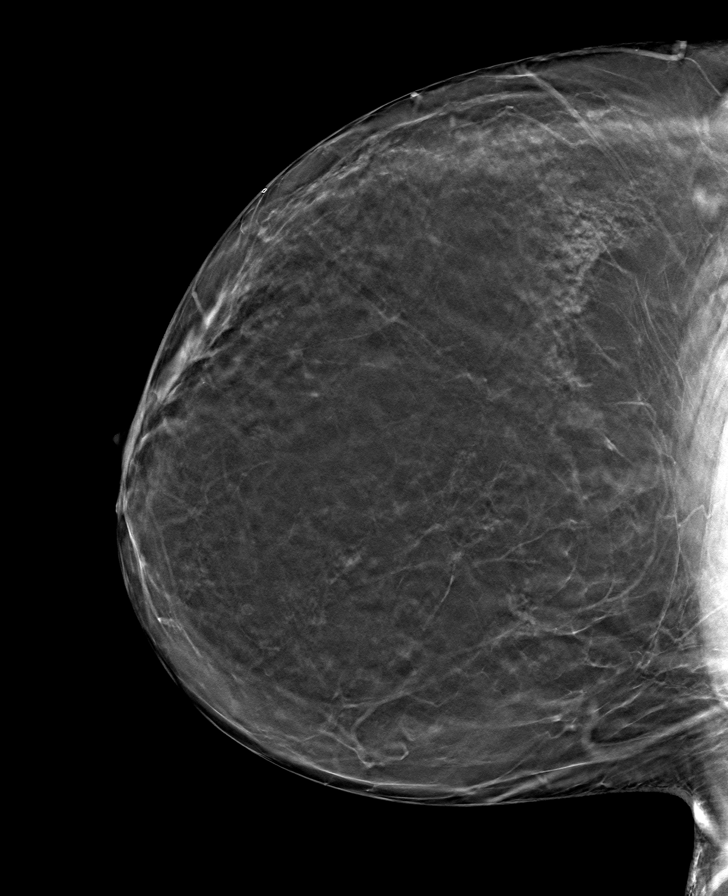

[8 of 24 positions shown; findings below may reference images not displayed]

ACR Breast Density Category b: There are scattered areas of
fibroglandular density.
FINDINGS: There are no findings suspicious for malignancy. Images were
processed with CAD.
IMPRESSION: No mammographic evidence of malignancy. A result letter of this
screening mammogram will be mailed directly to the patient.

RECOMMENDATION:
Screening mammogram in one year. (Code:[id number])

BI-RADS CATEGORY  1: Negative.
